# Patient Record
Sex: Female | Born: 1955
Health system: Southern US, Community
[De-identification: ages and names within clinical notes are randomized; demographics above are authoritative.]

## PROBLEM LIST (undated history)

## (undated) DIAGNOSIS — J302 Other seasonal allergic rhinitis: Secondary | ICD-10-CM

## (undated) DIAGNOSIS — G629 Polyneuropathy, unspecified: Secondary | ICD-10-CM

## (undated) DIAGNOSIS — J189 Pneumonia, unspecified organism: Secondary | ICD-10-CM

## (undated) DIAGNOSIS — R7301 Impaired fasting glucose: Secondary | ICD-10-CM

## (undated) DIAGNOSIS — C44599 Other specified malignant neoplasm of skin of other part of trunk: Principal | ICD-10-CM

## (undated) DIAGNOSIS — E039 Hypothyroidism, unspecified: Secondary | ICD-10-CM

## (undated) DIAGNOSIS — R0600 Dyspnea, unspecified: Secondary | ICD-10-CM

## (undated) DIAGNOSIS — F329 Major depressive disorder, single episode, unspecified: Secondary | ICD-10-CM

## (undated) DIAGNOSIS — F32A Depression, unspecified: Secondary | ICD-10-CM

## (undated) DIAGNOSIS — A498 Other bacterial infections of unspecified site: Secondary | ICD-10-CM

## (undated) DIAGNOSIS — F419 Anxiety disorder, unspecified: Secondary | ICD-10-CM

## (undated) DIAGNOSIS — F1110 Opioid abuse, uncomplicated: Secondary | ICD-10-CM

## (undated) DIAGNOSIS — Z8719 Personal history of other diseases of the digestive system: Secondary | ICD-10-CM

## (undated) DIAGNOSIS — E785 Hyperlipidemia, unspecified: Secondary | ICD-10-CM

## (undated) DIAGNOSIS — K589 Irritable bowel syndrome without diarrhea: Secondary | ICD-10-CM

## (undated) DIAGNOSIS — K760 Fatty (change of) liver, not elsewhere classified: Secondary | ICD-10-CM

## (undated) DIAGNOSIS — M5136 Other intervertebral disc degeneration, lumbar region: Secondary | ICD-10-CM

## (undated) DIAGNOSIS — K219 Gastro-esophageal reflux disease without esophagitis: Secondary | ICD-10-CM

## (undated) DIAGNOSIS — G6289 Other specified polyneuropathies: Secondary | ICD-10-CM

## (undated) DIAGNOSIS — Z85828 Personal history of other malignant neoplasm of skin: Secondary | ICD-10-CM

## (undated) DIAGNOSIS — M51369 Other intervertebral disc degeneration, lumbar region without mention of lumbar back pain or lower extremity pain: Secondary | ICD-10-CM

## (undated) DIAGNOSIS — T7840XA Allergy, unspecified, initial encounter: Secondary | ICD-10-CM

## (undated) DIAGNOSIS — I700 Atherosclerosis of aorta: Secondary | ICD-10-CM

## (undated) DIAGNOSIS — D126 Benign neoplasm of colon, unspecified: Secondary | ICD-10-CM

## (undated) DIAGNOSIS — Z87442 Personal history of urinary calculi: Secondary | ICD-10-CM

## (undated) DIAGNOSIS — Z86010 Personal history of colonic polyps: Secondary | ICD-10-CM

## (undated) DIAGNOSIS — D131 Benign neoplasm of stomach: Secondary | ICD-10-CM

## (undated) HISTORY — DX: Impaired fasting glucose: R73.01

## (undated) HISTORY — DX: Atherosclerosis of aorta: I70.0

## (undated) HISTORY — DX: Other specified polyneuropathies: G62.89

## (undated) HISTORY — DX: Other seasonal allergic rhinitis: J30.2

## (undated) HISTORY — DX: Hypothyroidism, unspecified: E03.9

## (undated) HISTORY — DX: Benign neoplasm of stomach: D13.1

## (undated) HISTORY — DX: Allergy, unspecified, initial encounter: T78.40XA

## (undated) HISTORY — PX: CHOLECYSTECTOMY: SHX55

## (undated) HISTORY — DX: Other intervertebral disc degeneration, lumbar region without mention of lumbar back pain or lower extremity pain: M51.369

## (undated) HISTORY — DX: Gastro-esophageal reflux disease without esophagitis: K21.9

## (undated) HISTORY — PX: COLONOSCOPY W/ BIOPSIES: SHX1374

## (undated) HISTORY — DX: Opioid abuse, uncomplicated: F11.10

## (undated) HISTORY — PX: COLONOSCOPY: SHX174

## (undated) HISTORY — DX: Personal history of colonic polyps: Z86.010

## (undated) HISTORY — DX: Fatty (change of) liver, not elsewhere classified: K76.0

## (undated) HISTORY — DX: Other bacterial infections of unspecified site: A49.8

## (undated) HISTORY — DX: Anxiety disorder, unspecified: F41.9

## (undated) HISTORY — DX: Polyneuropathy, unspecified: G62.9

## (undated) HISTORY — DX: Major depressive disorder, single episode, unspecified: F32.9

## (undated) HISTORY — DX: Hyperlipidemia, unspecified: E78.5

## (undated) HISTORY — DX: Irritable bowel syndrome, unspecified: K58.9

## (undated) HISTORY — DX: Personal history of other malignant neoplasm of skin: Z85.828

## (undated) HISTORY — DX: Other specified malignant neoplasm of skin of other part of trunk: C44.599

## (undated) HISTORY — DX: Benign neoplasm of colon, unspecified: D12.6

## (undated) HISTORY — DX: Other intervertebral disc degeneration, lumbar region: M51.36

## (undated) HISTORY — DX: Depression, unspecified: F32.A

## (undated) HISTORY — PX: LAPAROSCOPIC ENDOMETRIOSIS FULGURATION: SUR769

## (undated) HISTORY — PX: UPPER GASTROINTESTINAL ENDOSCOPY: SHX188

---

## 2009-01-10 HISTORY — PX: OTHER SURGICAL HISTORY: SHX169

## 2010-07-11 DIAGNOSIS — C44599 Other specified malignant neoplasm of skin of other part of trunk: Secondary | ICD-10-CM

## 2010-07-11 HISTORY — DX: Other specified malignant neoplasm of skin of other part of trunk: C44.599

## 2010-10-11 HISTORY — PX: OTHER SURGICAL HISTORY: SHX169

## 2011-05-16 ENCOUNTER — Encounter: Payer: Self-pay | Admitting: Physician Assistant

## 2011-05-16 ENCOUNTER — Ambulatory Visit (INDEPENDENT_AMBULATORY_CARE_PROVIDER_SITE_OTHER): Payer: Self-pay | Admitting: Physician Assistant

## 2011-05-16 VITALS — BP 119/74 | HR 82 | Ht 66.0 in | Wt 156.0 lb

## 2011-05-16 DIAGNOSIS — Z8589 Personal history of malignant neoplasm of other organs and systems: Secondary | ICD-10-CM

## 2011-05-16 DIAGNOSIS — H9193 Unspecified hearing loss, bilateral: Secondary | ICD-10-CM

## 2011-05-16 DIAGNOSIS — Z85831 Personal history of malignant neoplasm of soft tissue: Secondary | ICD-10-CM

## 2011-05-16 DIAGNOSIS — Z7689 Persons encountering health services in other specified circumstances: Secondary | ICD-10-CM

## 2011-05-16 DIAGNOSIS — E039 Hypothyroidism, unspecified: Secondary | ICD-10-CM

## 2011-05-16 DIAGNOSIS — H9190 Unspecified hearing loss, unspecified ear: Secondary | ICD-10-CM

## 2011-05-16 NOTE — Patient Instructions (Addendum)
Will be called with referral for oncologist and hearing testing. Follow up for management of hypothyroidsim.

## 2011-05-16 NOTE — Progress Notes (Addendum)
  Subjective:    Patient ID: Connie Dillon, female    DOB: 1955/11/28, 56 y.o.   MRN: [id number]  HPI Patient presents to the clinic to establish care. PMH was reviewed. 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 2012. She did not have chemo or radiation. She recently moved to [facility] from [facility] and wants to be established with oncology. She is reporting some pain along the incision line today.   She is also on synthroid for hypothyroidism. She does not need refills today and her labs were drawn in January and is requesting they be sent to our office.   She is having some difficulty hearing of the last several months. She wants referral to be evaluated for hearing loss. She is noticing hearing loss in both ears with right being worse than left. She is only on celexa, synthyroid, claritin. No new meds.    Review of Systems     Objective:   Physical Exam  Constitutional: She is oriented to person, place, and time. She appears well-developed and well-nourished.  HENT:  Head: Normocephalic and atraumatic.  Right Ear: External ear normal.  Left Ear: External ear normal.  Nose: Nose normal.  Mouth/Throat: Oropharynx is clear and moist. No oropharyngeal exudate.       TM's were well visualized with light reflex and no cerumen or blockage.  Both ears had loss of gross hearing.  Left ear- Weber was not able to be detected in either ear/[facility] greater than AC.  Right ear- Weber was not able to be detected in either ear/ Rinne AC greater than BC.  Cardiovascular: Normal rate, regular rhythm and normal heart sounds.   Pulmonary/Chest: Effort normal and breath sounds normal. She has no wheezes.  Neurological: She is alert and oriented to person, place, and time.  Skin: Skin is warm and dry.  Psychiatric: She has a normal mood and affect. Her behavior is normal.          Assessment & Plan:  Hx of sarcoma of soft tissue- referral was made to  oncologist.  Hearing loss bilaterally-Will refer for further evaluation. Appears to have sensory hearing loss in right ear and conduction hearing loss in left ear  Discussed need for colonoscopy. Patient does not have insurance at this time and realizes the need and will contact office when needs referral.

## 2011-05-20 NOTE — Progress Notes (Signed)
Addended by: Jomarie Longs on: 05/20/2011 10:01 AM   Modules accepted: Orders

## 2011-05-27 ENCOUNTER — Telehealth: Payer: Self-pay | Admitting: *Deleted

## 2011-05-27 NOTE — Telephone Encounter (Signed)
Patient new to area, time for her 6 month visit/scans with a Systems developer. She states she is having pain at previous incision site, instructed patient to continue with care from [facility] until we are able to establish patient here at [facility]. Patient verbalized understanding, orders to scheduling. Patient Phone # [phone].

## 2011-05-31 ENCOUNTER — Telehealth: Payer: Self-pay | Admitting: Oncology

## 2011-05-31 NOTE — Telephone Encounter (Signed)
Pt called re appt. New pt chart already ib scheduling and pt given appt for 6/3 @ 3 pm (next avail) - ok per Roz (tiffany).

## 2011-06-02 ENCOUNTER — Encounter: Payer: Self-pay | Admitting: Oncology

## 2011-06-13 ENCOUNTER — Ambulatory Visit: Payer: Self-pay

## 2011-06-13 ENCOUNTER — Other Ambulatory Visit (HOSPITAL_BASED_OUTPATIENT_CLINIC_OR_DEPARTMENT_OTHER): Payer: Self-pay | Admitting: Lab

## 2011-06-13 ENCOUNTER — Ambulatory Visit (HOSPITAL_BASED_OUTPATIENT_CLINIC_OR_DEPARTMENT_OTHER): Payer: Self-pay | Admitting: Oncology

## 2011-06-13 ENCOUNTER — Encounter: Payer: Self-pay | Admitting: Oncology

## 2011-06-13 ENCOUNTER — Telehealth: Payer: Self-pay | Admitting: Oncology

## 2011-06-13 VITALS — BP 117/70 | HR 74 | Temp 97.0°F | Ht 64.0 in | Wt 154.6 lb

## 2011-06-13 DIAGNOSIS — C44599 Other specified malignant neoplasm of skin of other part of trunk: Secondary | ICD-10-CM

## 2011-06-13 DIAGNOSIS — R109 Unspecified abdominal pain: Secondary | ICD-10-CM

## 2011-06-13 DIAGNOSIS — Z85828 Personal history of other malignant neoplasm of skin: Secondary | ICD-10-CM

## 2011-06-13 LAB — COMPREHENSIVE METABOLIC PANEL
ALT: 23 U/L (ref 0–35)
AST: 19 U/L (ref 0–37)
Albumin: 4 g/dL (ref 3.5–5.2)
CO2: 25 mEq/L (ref 19–32)
Calcium: 9.1 mg/dL (ref 8.4–10.5)
Chloride: 105 mEq/L (ref 96–112)
Potassium: 3.7 mEq/L (ref 3.5–5.3)
Total Protein: 7.2 g/dL (ref 6.0–8.3)

## 2011-06-13 LAB — CBC WITH DIFFERENTIAL/PLATELET
BASO%: 0.8 % (ref 0.0–2.0)
Eosinophils Absolute: 0.3 10*3/uL (ref 0.0–0.5)
HCT: 40.4 % (ref 34.8–46.6)
LYMPH%: 27.7 % (ref 14.0–49.7)
MCHC: 33.2 g/dL (ref 31.5–36.0)
MONO#: 1.2 10*3/uL — ABNORMAL HIGH (ref 0.1–0.9)
NEUT#: 5.3 10*3/uL (ref 1.5–6.5)
Platelets: 277 10*3/uL (ref 145–400)
RBC: 4.77 10*6/uL (ref 3.70–5.45)
WBC: 9.5 10*3/uL (ref 3.9–10.3)
lymph#: 2.6 10*3/uL (ref 0.9–3.3)
nRBC: 0 % (ref 0–0)

## 2011-06-13 NOTE — Patient Instructions (Addendum)
A.  History of dermatofibrosarcoma of the right flank. -No evidence of local recurrence.   B.  Follow up:  Chest Xray today.  Follow up with my Nurse Practitioner in about 4 months with same day chest Xray.

## 2011-06-13 NOTE — Telephone Encounter (Signed)
appts made and printed for pt aom °

## 2011-06-13 NOTE — Progress Notes (Signed)
Patient came in today as a new patient,she has no insurance but she did already have an Epp application fill out.I offer her a medicaid application but she said she would not qualify,because they turn her down in [place],I told her it maybe different here in [place],she said no thanks.

## 2011-06-14 ENCOUNTER — Encounter: Payer: Self-pay | Admitting: Oncology

## 2011-06-14 NOTE — Progress Notes (Signed)
[facility]  Telephone:([phone]) [phone] Fax:(336) [phone]   MEDICAL ONCOLOGY - INITIAL CONSULATION    Referral MD:  Tandy Gaw, PA-C  Reason for Referral: history of dermatofibrosarcoma.   HPI:  [name] is a 56 year-old woman with history of hypothyroidism, allergy, hyperlipidemia, anxiety, depression.  She was in [facility] until 07/2010 when she developed an enlarging right flank mass.  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  She had an MRI abdomen on 10/22/10 from [facility] which showed right lower quadrant mass in the dermis with minimal extension into the SQ fat; measured 2.8x0.9x1.8cm.  There was no adenopathy.  She was referred to Dr. Meda Klinefelter from [facility] and underwent in 10/2010 wide excision.  Final pathology report is not available for my review.  However, per her previous medical oncologist's note (Dr. Halford Decamp. El-Sayh), margin was widely negative. She has been on observation.  Her CT chest/abd/pel 01/27/2011 was negative.  She recently moved to [place] to be here her son in [place], [place].  She was kindly referred to the Cancer Center to establish oncology care.  [name] presented to the clinic today for the first time by herself.  She reported mild, intermittent right flank pain at the site of sarcoma resection in the past. The pain is slightly crampy, no radiation, no relation to eating or bowel movement.  The pain is not severe enough for her to take chronic pain meds; however, she is worried since it has been 8 months since the operation.  She has not noticed any palpable skin nodule compared to when the sarcoma was first discovered.  She has chronic fatigue; however, she is able to live by herself and is independent of activities of daily living. She has mild DOE on long walk but no PND, orthopnea, chest pain.    Patient denies headache, visual changes, confusion, drenching night sweats, palpable lymph node swelling, mucositis, odynophagia, dysphagia, nausea vomiting, jaundice, chest pain, palpitation, productive cough, gum bleeding, epistaxis, hematemesis, hemoptysis, abdominal swelling, early satiety, melena, hematochezia, hematuria, skin rash, spontaneous bleeding, joint swelling, joint pain, heat or cold intolerance, bowel bladder incontinence, back pain, focal motor weakness.      Past Medical History  Diagnosis Date  . Hyperlipidemia   . Hypothyroid   . Anxiety   . History of SCC (squamous cell carcinoma) of skin   . Depression   . 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 Meda Klinefelter in 10/2010. ([place])  :  Past Surgical History  Procedure Date  . Cholecystectomy   . Lipoma removal 2011  . Laparoscopic endometriosis fulguration 1980's  . Wide excision of dermatofibrosarcoma of right flank 10/2010    by a Dr. Meda Klinefelter in [facility].   :  Current Outpatient Prescriptions  Medication Sig Dispense Refill  . citalopram (CELEXA) 10 MG tablet Take 20 mg by mouth.       Marland Kitchen ibuprofen (ADVIL,MOTRIN) 200 MG tablet Take 200 mg by mouth every 6 (six) hours as needed.      Marland Kitchen levothyroxine (SYNTHROID, LEVOTHROID) 50 MCG tablet Take 50 mcg by mouth daily.      Marland Kitchen loratadine (CLARITIN) 10 MG tablet Take 10 mg by mouth daily.         Allergies  Allergen Reactions  . Peanuts (Peanut Oil) Hives  . Sudafed (Pseudoephedrine Hcl) Palpitations  :  Family History  Problem Relation Age of Onset  . Heart attack Father   . Stroke Father   . Hyperlipidemia Father   . Hypertension Father   . Cancer Paternal Aunt     breast cancer  :  History   Social History  . Marital Status: Single    Spouse Name: N/A    Number of Children: 2  . Years of Education: N/A   Occupational History  .      looking for work now.    Social History Main Topics  .  Smoking status: Former Smoker -- 1.0 packs/day for 10 years    Quit date: 05/15/2004  . Smokeless tobacco: Never Used  . Alcohol Use: No  . Drug Use: No  . Sexually Active: Not Currently   Other Topics Concern  . Not on file   Social History Narrative  . No narrative on file  :  Pertinent items are noted in HPI.  Exam: ECOG 1  General:  well-nourished woman, in no acute distress.  Eyes:  no scleral icterus.  ENT:  There were no oropharyngeal lesions.  Neck was without thyromegaly.  Lymphatics:  Negative cervical, supraclavicular or axillary adenopathy.  Respiratory: lungs were clear bilaterally without wheezing or crackles.  Cardiovascular:  Regular rate and rhythm, S1/S2, without murmur, rub or gallop.  There was no pedal edema.  GI:  abdomen was soft, flat, nontender, nondistended, without organomegaly.  Muscoloskeletal:  no spinal tenderness of palpation of vertebral spine. There was an large excisional scar on her right shoulder without palpable mass.  There was also another excisional scar in the right flank extending anteriorly to the right quadrant.  There was mild discomfort to palpation but no discrete mass.  Skin exam was without echymosis, petichae.  Neuro exam was nonfocal.  Patient was able to get on and off exam table without assistance.  Gait was normal.  Patient was alerted and oriented.  Attention was good.   Language was appropriate.  Mood was normal without depression.  Speech was not pressured.  Thought content was not tangential.     Lab Results  Component Value Date   WBC 9.5 06/13/2011   HGB 13.4 06/13/2011   HCT 40.4 06/13/2011   PLT 277 06/13/2011   GLUCOSE 80 06/13/2011   ALT 23 06/13/2011   AST 19 06/13/2011   NA 140 06/13/2011   K 3.7 06/13/2011   CL 105 06/13/2011   CREATININE 0.73 06/13/2011   BUN 14 06/13/2011   CO2 25 06/13/2011    Assessment and Plan:  1.  History of dermatofibrosarcoma protuberans: - S/p complete excision with negative margin in [place] in 10/2010.    Repeat imaging in 01/2011 showed no residual or metastatic disease.  - I discussed with Ms. Igoe that this cancer has relatively low potential of distant spread; but if it does, lung met is the most common.   Given that she has mild SOB, I went ahead and requested CXR to rule out lung met.  I do not advocate routine CT chest unless abnormal CXR to minimize radiation exposure.  However, she does have persistent right flank pain despite being 8 months out from the resection, I requested a CT abdomen to rule out deep invasion of her disease.   - I discussed with her that there is no role for chemo or radiation in this type of sarcoma unless she has recurrent or metastatic disease that is not resectable.   2.  Depression: she  is on Celexa per PCP.  Her mood seemed stable today.   3.  Hypothyroidism:  She is on Synthroid per PCP.  Her last TSH was within normal range.   4.  Follow up:  In about 4-6 months with same day CT chest.     Thank you for this referral.   The length of time of the face-to-face encounter was 30 minutes. More than 50% of time was spent counseling and coordination of care.

## 2011-06-21 ENCOUNTER — Other Ambulatory Visit: Payer: Self-pay | Admitting: Oncology

## 2011-06-21 ENCOUNTER — Telehealth: Payer: Self-pay | Admitting: *Deleted

## 2011-06-21 ENCOUNTER — Ambulatory Visit (HOSPITAL_COMMUNITY)
Admission: RE | Admit: 2011-06-21 | Discharge: 2011-06-21 | Disposition: A | Discharge status: Home / Self Care | Payer: Self-pay | Source: Ambulatory Visit | Attending: Oncology | Admitting: Oncology

## 2011-06-21 DIAGNOSIS — R109 Unspecified abdominal pain: Secondary | ICD-10-CM

## 2011-06-21 DIAGNOSIS — C44599 Other specified malignant neoplasm of skin of other part of trunk: Secondary | ICD-10-CM

## 2011-06-21 DIAGNOSIS — K449 Diaphragmatic hernia without obstruction or gangrene: Secondary | ICD-10-CM | POA: Insufficient documentation

## 2011-06-21 MED ORDER — IOHEXOL 300 MG/ML  SOLN
100.0000 mL | Freq: Once | INTRAMUSCULAR | Status: AC | PRN
  Administered 2011-06-21: 100 mL via INTRAVENOUS

## 2011-06-21 NOTE — Telephone Encounter (Signed)
THIS REPORT WAS CALLED AND FAXED. THE REPORT WAS GIVEN TO KRISTIN CURICO SINCE DR.HA IS OUT OF THE OFFICE.

## 2011-06-22 ENCOUNTER — Telehealth: Payer: Self-pay | Admitting: Oncology

## 2011-06-22 ENCOUNTER — Other Ambulatory Visit: Payer: Self-pay | Admitting: Oncology

## 2011-06-22 ENCOUNTER — Encounter: Payer: Self-pay | Admitting: Oncology

## 2011-06-22 DIAGNOSIS — J9859 Other diseases of mediastinum, not elsewhere classified: Secondary | ICD-10-CM

## 2011-06-22 DIAGNOSIS — C44599 Other specified malignant neoplasm of skin of other part of trunk: Secondary | ICD-10-CM

## 2011-06-22 NOTE — Telephone Encounter (Signed)
I called and left message impression cell phone today. I informed her that the seed alum and did not show sign of recurrent of her sarcoma. However on the chest x-ray there was a mid sternal nodule that the reduction recommended followup CT of the chest. Most likely this benign however I recommend a CT of the chest as well which will be scheduled in the next week or 2.

## 2011-06-23 ENCOUNTER — Encounter: Payer: Self-pay | Admitting: Oncology

## 2011-06-23 ENCOUNTER — Telehealth: Payer: Self-pay | Admitting: Oncology

## 2011-06-23 NOTE — Progress Notes (Signed)
Patient approve 100% [employer]  06/23/11 - 12/23/11.

## 2011-06-23 NOTE — Telephone Encounter (Signed)
pt called and scheduled ct scan for 06/18 at [facility]

## 2011-06-28 ENCOUNTER — Ambulatory Visit (HOSPITAL_COMMUNITY)
Admission: RE | Admit: 2011-06-28 | Discharge: 2011-06-28 | Disposition: A | Discharge status: Home / Self Care | Payer: Self-pay | Source: Ambulatory Visit | Attending: Oncology | Admitting: Oncology

## 2011-06-28 ENCOUNTER — Telehealth: Payer: Self-pay | Admitting: Oncology

## 2011-06-28 DIAGNOSIS — C44599 Other specified malignant neoplasm of skin of other part of trunk: Secondary | ICD-10-CM

## 2011-06-28 DIAGNOSIS — K449 Diaphragmatic hernia without obstruction or gangrene: Secondary | ICD-10-CM | POA: Insufficient documentation

## 2011-06-28 DIAGNOSIS — J8410 Pulmonary fibrosis, unspecified: Secondary | ICD-10-CM | POA: Insufficient documentation

## 2011-06-28 DIAGNOSIS — I700 Atherosclerosis of aorta: Secondary | ICD-10-CM | POA: Insufficient documentation

## 2011-06-28 DIAGNOSIS — Z9089 Acquired absence of other organs: Secondary | ICD-10-CM | POA: Insufficient documentation

## 2011-06-28 DIAGNOSIS — J9859 Other diseases of mediastinum, not elsewhere classified: Secondary | ICD-10-CM

## 2011-06-28 MED ORDER — IOHEXOL 300 MG/ML  SOLN
80.0000 mL | Freq: Once | INTRAMUSCULAR | Status: AC | PRN
  Administered 2011-06-28: 80 mL via INTRAVENOUS

## 2011-06-28 NOTE — Telephone Encounter (Signed)
I personally called patient.  I personally reviewed the CT chest.  There was no evidence of metastatic sarcoma.  There was a benign appearing calcified nodule that is most likely granuloma.   She expressed relief.  She still reports mild SOB and DOE.  I advised her to f/u with her PCP to see if there is concern for cardiac or pulmonary causes of her SOB.

## 2011-07-06 ENCOUNTER — Other Ambulatory Visit: Payer: Self-pay | Admitting: Physician Assistant

## 2011-07-06 ENCOUNTER — Encounter: Payer: Self-pay | Admitting: Physician Assistant

## 2011-07-06 ENCOUNTER — Ambulatory Visit (INDEPENDENT_AMBULATORY_CARE_PROVIDER_SITE_OTHER): Payer: Self-pay | Admitting: Physician Assistant

## 2011-07-06 VITALS — BP 109/73 | HR 83 | Wt 156.0 lb

## 2011-07-06 DIAGNOSIS — R0602 Shortness of breath: Secondary | ICD-10-CM

## 2011-07-06 DIAGNOSIS — E785 Hyperlipidemia, unspecified: Secondary | ICD-10-CM

## 2011-07-06 MED ORDER — PITAVASTATIN CALCIUM 2 MG PO TABS: ORAL_TABLET | ORAL | Status: DC

## 2011-07-06 MED ORDER — METHYLPREDNISOLONE ACETATE 80 MG/ML IJ SUSP
80.0000 mg | Freq: Once | INTRAMUSCULAR | Status: AC
  Administered 2011-07-06: 80 mg via INTRAMUSCULAR

## 2011-07-06 NOTE — Patient Instructions (Addendum)
Will call with stress test and labs. Follow up in 3 months to recheck cholesterol. Call office if worsening.

## 2011-07-07 ENCOUNTER — Telehealth: Payer: Self-pay | Admitting: *Deleted

## 2011-07-07 LAB — TSH: TSH: 2.453 u[IU]/mL (ref 0.350–4.500)

## 2011-07-07 LAB — T3: T3, Total: 90.2 ng/dL (ref 80.0–204.0)

## 2011-07-07 LAB — BRAIN NATRIURETIC PEPTIDE: Brain Natriuretic Peptide: 34.5 pg/mL (ref 0.0–100.0)

## 2011-07-07 NOTE — Telephone Encounter (Signed)
Pt calls office back and states she thought she was having the T3 and T4 checked because she states that the T4 is usually what she has a problem with . Please advise

## 2011-07-07 NOTE — Telephone Encounter (Signed)
See if we can add T4 and T3.

## 2011-07-07 NOTE — Telephone Encounter (Signed)
Additional thyroid labs added. KG LPN

## 2011-07-08 ENCOUNTER — Other Ambulatory Visit: Payer: Self-pay | Admitting: Physician Assistant

## 2011-07-08 ENCOUNTER — Telehealth: Payer: Self-pay | Admitting: Physician Assistant

## 2011-07-08 DIAGNOSIS — R0602 Shortness of breath: Secondary | ICD-10-CM

## 2011-07-08 MED ORDER — PREDNISONE 20 MG PO TABS: 20.0000 mg | ORAL_TABLET | Freq: Two times a day (BID) | ORAL | Status: AC

## 2011-07-08 MED ORDER — ALBUTEROL SULFATE HFA 108 (90 BASE) MCG/ACT IN AERS: 2.0000 | INHALATION_SPRAY | Freq: Four times a day (QID) | RESPIRATORY_TRACT | Status: DC | PRN

## 2011-07-08 NOTE — Telephone Encounter (Signed)
Pt returned call and I called her back and LM to inform her to go to [facility] [phone]) for her d-dimer to be drawn.

## 2011-07-08 NOTE — Progress Notes (Signed)
LM for pt to call back to see how she is feeling.

## 2011-07-08 NOTE — Telephone Encounter (Signed)
LMOM for pt with instructions and to return call to make sure she received it.

## 2011-07-08 NOTE — Telephone Encounter (Signed)
Pt returned my call about how she is feeling. Pt states with activity she is [facility] even with walking from one room to another. States that she is not having any side effects from the cholesterol med. She is concerned about her TSH going up and down year to year. She said you also mentioned about doing a stress test on her. Please advise.

## 2011-07-08 NOTE — Telephone Encounter (Signed)
Order is in for stress test, should be called today or Monday. Since SOB is not improving with steroid lets get a d-dimer to today to see if when need to do scan to look for PE. Come in today.

## 2011-07-08 NOTE — Progress Notes (Signed)
  Subjective:    Patient ID: Connie Dillon, female    DOB: [birth year], 56 y.o.   MRN: [id number]  HPI Patient presents to clinic with SOB that is recent for about 2-3 weeks. Pt sees oncology and has been released as far as sarcoma. He did order a chest CT which showed no masses but a 9mm granuloma along with hital hernia. CBC  Was also done with hgB 13.4 and WBC was 9.5. WBC Upper limit normal but still normal. SOB has started to interfer with daily activities. She has a sedentary job but even getting up and moving around causes her to be out of breath. Movement makes worse. Does not describe any chest pain, palpitations, lower ext swelling or changes. No recent travel, surgery, changes in meds, hospitalization. She did smoke of 10 years but quit 12 years ago. No hx of asthma or pulmonary disorders. She has hx of bad sinus infection but denies any sinus pressure or symptoms today. No fever, chills, myaglias.   Not had lipids checked recently. Not able to tolerate statins due to heart racing. Lipids have been elevated for years. CT of chest did show mild plaque in thoracic region.   Discussed anxiety. Pt does not feel this could be anxiety related.   Review of Systems     Objective:   Physical Exam  Constitutional: She is oriented to person, place, and time. She appears well-developed and well-nourished.  HENT:  Head: Normocephalic and atraumatic.  Cardiovascular: Normal rate, regular rhythm, normal heart sounds and intact distal pulses.   No murmur heard. Pulmonary/Chest: Effort normal and breath sounds normal. She has no wheezes.  Neurological: She is alert and oriented to person, place, and time.  Skin: Skin is warm and dry. She is not diaphoretic.  Psychiatric: She has a normal mood and affect. Her behavior is normal.          Assessment & Plan:  SOB- EKG- NSR and axis/No ST changes. SP02 was 96%. Gave depo medrol in office for any inflammation. Pt is not able to use albuterol  inhalers and did not want to keep one on hand due to making her heart race. Will call with stress test and labs. Need to keep SOB from hital hernia and worsening anxiety on differential. Follow up in 3 months. Call office if worsening.   Hyperlipidemia- Gave old labs where LDL was elevated. Gave rx for livalo to try daily to see if she tolerates will recheck in 3 months.

## 2011-07-08 NOTE — Progress Notes (Signed)
Will you find out if patient is doing any better with SOB?

## 2011-07-11 ENCOUNTER — Telehealth: Payer: Self-pay | Admitting: *Deleted

## 2011-07-11 NOTE — Telephone Encounter (Signed)
Exercise stress test was ordered it is in file under ECG. Will remind Connie Dillon to scheduled. Can give a cheaper statin but it does not have the lower side effect profile of Livalo. Is she will to try another regular statin? Never heard of orthozine. Have pharmacy send information on it and will prescribe if will be beneficial.

## 2011-07-11 NOTE — Telephone Encounter (Signed)
Pt states that she cant afford the Livalo you gave her. States even with the card it $75. She would like something else called that is cheaper. She also states she cant take the bronchodialator you called in. States that she cant take anything with Efedrine in it and that the pharmacy suggested orthozine. States she got a vitamin called Clear lungs but it only helps when she is at rest. She is also asking about the stress test that is suppose to be scheduled. I didn't see it in the chart. Please advise.

## 2011-07-12 ENCOUNTER — Other Ambulatory Visit: Payer: Self-pay | Admitting: Physician Assistant

## 2011-07-12 MED ORDER — PRAVASTATIN SODIUM 40 MG PO TABS: 40.0000 mg | ORAL_TABLET | Freq: Every day | ORAL | Status: DC

## 2011-07-12 NOTE — Telephone Encounter (Signed)
Informed pt that jen is scheduling the stress test. Pt is willing to try a cheaper statin. States she will call pharmacy and have them send over information on the Orthozine to you.

## 2011-07-12 NOTE — Telephone Encounter (Signed)
LM for pt to returncall

## 2011-07-15 ENCOUNTER — Ambulatory Visit: Payer: Self-pay | Admitting: Physician Assistant

## 2011-07-15 ENCOUNTER — Ambulatory Visit (INDEPENDENT_AMBULATORY_CARE_PROVIDER_SITE_OTHER): Payer: Self-pay | Admitting: Physician Assistant

## 2011-07-15 ENCOUNTER — Encounter: Payer: Self-pay | Admitting: Physician Assistant

## 2011-07-15 VITALS — BP 107/66 | HR 91 | Ht 66.0 in | Wt 157.0 lb

## 2011-07-15 DIAGNOSIS — R0602 Shortness of breath: Secondary | ICD-10-CM

## 2011-07-15 NOTE — Patient Instructions (Addendum)
Gave Dexilant once a day. Call and let us know if working. Will call as soon as we get Stress test results.

## 2011-07-18 ENCOUNTER — Encounter: Payer: Self-pay | Admitting: *Deleted

## 2011-07-18 ENCOUNTER — Ambulatory Visit: Payer: Self-pay | Admitting: Physician Assistant

## 2011-07-18 DIAGNOSIS — Z0289 Encounter for other administrative examinations: Secondary | ICD-10-CM

## 2011-07-18 NOTE — Progress Notes (Signed)
  Subjective:    Patient ID: Connie Dillon, female    DOB: September 13, 1955, 56 y.o.   MRN: [id number]  HPI Patient comes in for spirometry and f/u on SOB. Shortness of Breath has not gotten any worse but is still constant but worse with exertion. She also now has pain right in between breast. She does not notice any correlation with what she eats and pain. She does have an hital hernia. She has her stress test scheduled for next week. She denies any radiation of chest pain. The recuse inhaler that was given does not help when she takes it. The prednisone did not help at all.   Review of Systems     Objective:   Physical Exam  Constitutional: She is oriented to person, place, and time. She appears well-developed and well-nourished.  HENT:  Head: Normocephalic and atraumatic.  Cardiovascular: Normal rate, regular rhythm and normal heart sounds.   Pulmonary/Chest: Effort normal and breath sounds normal. She has no wheezes.  Neurological: She is alert and oriented to person, place, and time.  Skin: Skin is warm and dry.  Psychiatric: She has a normal mood and affect. Her behavior is normal.          Assessment & Plan:  SOB/atypical chest pain- SPO2 is 97%. Spirometry is normal. No sig improvement after neb treatment. Told pt to call stress test center and ask to be put on list to call for any cancellations. If not will call with results as soon as we get. Reassured that SOB is not likely pulmonary in cause. Chest CT, EKG, d dimer, bnp and spirometry were all normal. Stress test will be last work up for cardiac. Let's see if GERD could be causing some of these symptoms. Gave samples of Dexilant to start daily in am before breakfast. Call if need script b/c working.

## 2011-07-21 ENCOUNTER — Telehealth: Payer: Self-pay | Admitting: *Deleted

## 2011-07-21 MED ORDER — PANTOPRAZOLE SODIUM 40 MG PO TBEC: 40.0000 mg | DELAYED_RELEASE_TABLET | Freq: Every day | ORAL | Status: DC

## 2011-07-21 NOTE — Telephone Encounter (Signed)
90 day supply of Protonix 40mg  sent to [facility].

## 2011-07-21 NOTE — Telephone Encounter (Signed)
Called [facility] for price on Protonix 40 mg and have called pt to let me know if she is interested in me send rx to them. LMOM for pt to return call.

## 2011-07-21 NOTE — Telephone Encounter (Signed)
Check [facility]. I think protonix is on their cheaper list if you get a 3 or 6 month supply. Please check price and if pt agree send rx to [facility] they also do free shipping.

## 2011-07-21 NOTE — Telephone Encounter (Signed)
Spoke with pt and she will call back with how many month supply she would like.

## 2011-07-21 NOTE — Telephone Encounter (Signed)
Pt states that the Dexilant is helping and she would like to wait on doing the stress test. I have given pt a coupon card for the Dexilant since she has no insurance. States she was told med was expensive and states maybe there is something that will be cheaper that will do the same job. I gave pt samples. Please advise if we should send rx for Dexilant or something different.

## 2011-07-25 ENCOUNTER — Encounter: Payer: Self-pay | Admitting: Physician Assistant

## 2011-07-29 ENCOUNTER — Other Ambulatory Visit: Payer: Self-pay | Admitting: Physician Assistant

## 2011-07-29 NOTE — Telephone Encounter (Signed)
Pt notified and states she already has an inhaler and will use that and if no better will let us know. KG LPN

## 2011-07-29 NOTE — Telephone Encounter (Signed)
Pt calls wanting to know if she could get some more prednisone for her breathing issue

## 2011-07-29 NOTE — Telephone Encounter (Signed)
Will not refill prednisone. If she is still having SOB she can come get sample of a daily ICS. That she would use Qvar 1 puff twice a day. If that helps we could call her in some.

## 2011-08-24 ENCOUNTER — Encounter: Payer: Self-pay | Admitting: Physician Assistant

## 2011-08-24 ENCOUNTER — Ambulatory Visit (INDEPENDENT_AMBULATORY_CARE_PROVIDER_SITE_OTHER): Payer: Self-pay | Admitting: Physician Assistant

## 2011-08-24 VITALS — BP 110/67 | HR 90 | Ht 66.0 in | Wt 155.0 lb

## 2011-08-24 DIAGNOSIS — L821 Other seborrheic keratosis: Secondary | ICD-10-CM

## 2011-08-24 DIAGNOSIS — L919 Hypertrophic disorder of the skin, unspecified: Secondary | ICD-10-CM

## 2011-08-24 DIAGNOSIS — Q828 Other specified congenital malformations of skin: Secondary | ICD-10-CM

## 2011-08-24 NOTE — Progress Notes (Signed)
  Subjective:    Patient ID: Connie Dillon, female    DOB: 1955/02/16, 56 y.o.   MRN: [id number]  HPI Patient presents to clinic with moles that she is worried about. She has had them for at least 3 months but they continue to worry her. She has 3 lesions under her right arm as well as 3-4 on her abdomen and one under her right breast. They itch but are not painful. They have not bled. The one under her right breast is irritated by wearing her bra and the ones under her arm itch all the time. The other lesions do not bother her. She does have a hx of sarcoma and any skin lesions cause her to worry.    Review of Systems     Objective:   Physical Exam  Constitutional: She is oriented to person, place, and time. She appears well-developed and well-nourished.  HENT:  Head: Normocephalic and atraumatic.  Neurological: She is alert and oriented to person, place, and time.  Skin:       2 skin tags under right axilla. 1 SK under right axilla. Other SK's all over trunk more specific SK under right breast.   Psychiatric: She has a normal mood and affect. Her behavior is normal.          Assessment & Plan:  Cryotherapy Procedure Note  Pre-operative Diagnosis: Seborheic keratosis and Skin tags  Post-operative Diagnosis: same  Locations:  (2 skin tags and 1 SK right axilla, 1 SK under right breast)   Indications: irritation  Anesthesia: not required   Procedure Details  History of allergy to iodine: no. Pacemaker? no.  Patient informed of risks (permanent scarring, infection, light or dark discoloration, bleeding, infection, weakness, numbness and recurrence of the lesion) and benefits of the procedure and verbal informed consent obtained.  The areas are treated with liquid nitrogen therapy, frozen until ice ball extended 2 mm beyond lesion, allowed to thaw, and treated again. The patient tolerated procedure well.  The patient was instructed on post-op care, warned that there may be  blister formation, redness and pain. Recommend OTC analgesia as needed for pain.  Condition: Stable  Complications: none.  Plan: 1. Instructed to keep the area dry and covered for 24-48h and clean thereafter. 2. Warning signs of infection were reviewed.   3. Recommended that the patient use OTC acetaminophen as needed for pain.

## 2011-08-24 NOTE — Patient Instructions (Addendum)
1. Instructed to keep the area dry and covered for 24-48h and clean thereafter. 2. Warning signs of infection were reviewed.   3. Recommended that the patient use OTC acetaminophen as needed for pain.   Seborrheic Keratosis Seborrheic keratosis is a common, noncancerous (benign) skin growth that can occur anywhere on the skin.It looks like "stuck-on," waxy, rough, tan, brown, or black spots on the skin. These skin growths can be flat or raised.They are often called "barnacles" because of their pasted-on appearance.Usually, these skin growths appear in adulthood, around age 65, and increase in number as you age. They may also develop during pregnancy or following estrogen therapy. Many people may only have one growth appear in their lifetime, while some people may develop many growths. CAUSES It is unknown what causes these skin growths, but they appear to run in families. SYMPTOMS Seborrheic keratosis is often located on the face, chest, shoulders, back, or other areas. These growths are:  Usually painless, but may become irritated and itchy.   Yellow, brown, black, or other colors.   Slightly raised or have a flat surface.   Sometimes rough or wart-like in texture.   Often waxy on the surface.   Round or oval-shaped.   Sometimes "stuck-on" in appearance.   Sometimes single, but there are usually many growths.  Any growth that bleeds, itches on a regular basis, becomes inflamed, or becomes irritated needs to be evaluated by a skin specialist (dermatologist). DIAGNOSIS Diagnosis is mainly based on the way the growths appear. In some cases, it can be difficult to tell this type of skin growth from skin cancer. A skin growth tissue sample (biopsy) may be used to confirm the diagnosis. TREATMENT Most often, treatment is not needed because the skin growths are benign.If the skin growth is irritated easily by clothing or jewelry, causing it to scab or bleed, treatment may be recommended.  Patients may also choose to have the growths removed because they do not like their appearance. Most commonly, these growths are treated with cryosurgery. In cryosurgery, liquid nitrogen is applied to "freeze" the growth. The growth usually falls off within a matter of days. A blister may form and dry into a scab that will also fall off. After the growth or scab falls off, it may leave a dark or light spot on the skin. This color may fade over time, or it may remain permanent on the skin. HOME CARE INSTRUCTIONS If the skin growths are treated with cryosurgery, the treated area needs to be kept clean with water and soap. SEEK MEDICAL CARE IF:  You have questions about these growths or other skin problems.   You develop new symptoms, including:   A change in the appearance of the skin growth.   New growths.   Any bleeding, itching, or pain in the growths.   A skin growth that looks similar to seborrheic keratosis.  Document Released: 01/29/2010 Document Revised: 12/16/2010 Document Reviewed: 01/29/2010 [facility] Patient Information 2012 [place], [place].

## 2011-10-07 ENCOUNTER — Ambulatory Visit: Payer: Self-pay | Admitting: Physician Assistant

## 2011-10-14 ENCOUNTER — Telehealth: Payer: Self-pay | Admitting: *Deleted

## 2011-10-14 ENCOUNTER — Telehealth: Payer: Self-pay | Admitting: Oncology

## 2011-10-14 NOTE — Telephone Encounter (Signed)
pt called to r/s her 10/18 to 10/21   aom

## 2011-10-14 NOTE — Telephone Encounter (Signed)
Pt called wants to r/s her lab and office visit appt to afternoon.  Transferred her to scheduling.

## 2011-10-19 ENCOUNTER — Ambulatory Visit (HOSPITAL_COMMUNITY)
Admission: RE | Admit: 2011-10-19 | Discharge: 2011-10-19 | Disposition: A | Discharge status: Home / Self Care | Payer: Self-pay | Source: Ambulatory Visit | Attending: Oncology | Admitting: Oncology

## 2011-10-19 DIAGNOSIS — C44599 Other specified malignant neoplasm of skin of other part of trunk: Secondary | ICD-10-CM

## 2011-10-19 DIAGNOSIS — R109 Unspecified abdominal pain: Secondary | ICD-10-CM

## 2011-10-19 DIAGNOSIS — R911 Solitary pulmonary nodule: Secondary | ICD-10-CM | POA: Insufficient documentation

## 2011-10-20 ENCOUNTER — Telehealth: Payer: Self-pay | Admitting: *Deleted

## 2011-10-20 NOTE — Telephone Encounter (Signed)
Message copied by Wende Mott on Thu Oct 20, 2011  1:23 PM ------      Message from: HA, Raliegh Ip T      Created: Thu Oct 20, 2011  1:03 PM       Please call pt. Her CXR was negative; no evidence of metastatic sarcoma.  Continue observation for history of sarcoma.  Thanks.

## 2011-10-20 NOTE — Telephone Encounter (Signed)
Pt returned call and I informed her of CXR negative w/ no evidence of sarcoma per Dr. Gaylyn Rong.  Keep appt on 10/21 as scheduled.  She verbalized understanding.

## 2011-10-20 NOTE — Telephone Encounter (Signed)
Left pt VM for her to return call.

## 2011-10-28 ENCOUNTER — Ambulatory Visit: Payer: Self-pay | Admitting: Oncology

## 2011-10-28 ENCOUNTER — Other Ambulatory Visit: Payer: Self-pay | Admitting: Lab

## 2011-10-31 ENCOUNTER — Ambulatory Visit (HOSPITAL_BASED_OUTPATIENT_CLINIC_OR_DEPARTMENT_OTHER): Payer: Self-pay | Admitting: Oncology

## 2011-10-31 ENCOUNTER — Telehealth: Payer: Self-pay | Admitting: Oncology

## 2011-10-31 ENCOUNTER — Other Ambulatory Visit (HOSPITAL_BASED_OUTPATIENT_CLINIC_OR_DEPARTMENT_OTHER): Payer: Self-pay | Admitting: Lab

## 2011-10-31 VITALS — BP 109/68 | HR 83 | Temp 97.6°F | Resp 20 | Ht 66.0 in | Wt 158.1 lb

## 2011-10-31 DIAGNOSIS — R109 Unspecified abdominal pain: Secondary | ICD-10-CM

## 2011-10-31 DIAGNOSIS — C44599 Other specified malignant neoplasm of skin of other part of trunk: Secondary | ICD-10-CM

## 2011-10-31 DIAGNOSIS — C4499 Other specified malignant neoplasm of skin, unspecified: Secondary | ICD-10-CM

## 2011-10-31 DIAGNOSIS — E039 Hypothyroidism, unspecified: Secondary | ICD-10-CM

## 2011-10-31 DIAGNOSIS — F329 Major depressive disorder, single episode, unspecified: Secondary | ICD-10-CM

## 2011-10-31 LAB — CBC WITH DIFFERENTIAL/PLATELET
Basophils Absolute: 0.1 10*3/uL (ref 0.0–0.1)
Eosinophils Absolute: 0.2 10*3/uL (ref 0.0–0.5)
HCT: 39.9 % (ref 34.8–46.6)
HGB: 13.2 g/dL (ref 11.6–15.9)
MCV: 87.1 fL (ref 79.5–101.0)
MONO%: 11.7 % (ref 0.0–14.0)
NEUT#: 4.6 10*3/uL (ref 1.5–6.5)
NEUT%: 60.2 % (ref 38.4–76.8)
RDW: 12.9 % (ref 11.2–14.5)

## 2011-10-31 LAB — COMPREHENSIVE METABOLIC PANEL (CC13)
Albumin: 3.7 g/dL (ref 3.5–5.0)
Alkaline Phosphatase: 74 U/L (ref 40–150)
BUN: 10 mg/dL (ref 7.0–26.0)
Calcium: 10 mg/dL (ref 8.4–10.4)
Chloride: 107 mEq/L (ref 98–107)
Creatinine: 0.8 mg/dL (ref 0.6–1.1)
Glucose: 94 mg/dl (ref 70–99)
Potassium: 4 mEq/L (ref 3.5–5.1)

## 2011-10-31 NOTE — Telephone Encounter (Signed)
appts made and printed for the pt,pt will get a cxr after the lab  aom

## 2011-11-01 ENCOUNTER — Encounter: Payer: Self-pay | Admitting: Oncology

## 2011-11-01 NOTE — Progress Notes (Signed)
[facility]  [phone] [phone]   OFFICE PROGRESS NOTE   Cc:  BREEBACK, JADE, PA-C  DIAGNOSIS: History of dermatofibrosarcoma protuberans  PAST THERAPY: She underwent a shave biopsy on 08/10/2010 with pathology (from [facility]; case # [id number]) consistent with dermatofibrosarcoma protuberans, transected at base. She had an MRI abdomen on 10/22/10 from [facility] which showed right lower quadrant mass in the dermis with minimal extension into the SQ fat; measured 2.8x0.9x1.8cm. There was no adenopathy. She was referred to Dr. Meda Klinefelter from [facility] and underwent in 10/2010 wide excision. Final pathology report is not available for my review. However, per her previous medical oncologist's note (Dr. Halford Decamp. [place]Say[place]), margin was widely negative.  CURRENT THERAPY: Watchful observation  INTERVAL HISTORY: Connie Dillon 56 y.o. female returns for routine follow-up by herself. She has continued to do well. She has intermittent pain to her right lower abdomen at the site of her surgery. She does not take any pain medications for this. She has not noticed any new skin lesions. She has chronic fatigue, but she is working full-time in a temporary position. Denies chest pain, shortness of breath, dyspnea. No bleeding. Appetite and weight remain stable.  Past Medical History  Diagnosis Date  . Hyperlipidemia   . Hypothyroid   . Anxiety   . History of SCC (squamous cell carcinoma) of skin   . Depression   . 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 Meda Klinefelter in 10/2010. [employer])  . Mediastinal mass 06/22/2011    Past Surgical History  Procedure Date  . Cholecystectomy   . Lipoma removal 2011  . Laparoscopic endometriosis fulguration 1980's  . Wide excision of dermatofibrosarcoma of right flank 10/2010    by a Dr. Meda Klinefelter in [facility].     Current Outpatient Prescriptions   Medication Sig Dispense Refill  . citalopram (CELEXA) 10 MG tablet Take 20 mg by mouth.       Marland Kitchen ibuprofen (ADVIL,MOTRIN) 200 MG tablet Take 200 mg by mouth every 6 (six) hours as needed.      Marland Kitchen levothyroxine (SYNTHROID, LEVOTHROID) 50 MCG tablet Take 50 mcg by mouth daily.      Marland Kitchen loratadine (CLARITIN) 10 MG tablet Take 10 mg by mouth daily.      . pantoprazole (PROTONIX) 40 MG tablet Take 1 tablet (40 mg total) by mouth daily.  90 tablet  0    ALLERGIES:  is allergic to peanuts; salmon; and sudafed.  REVIEW OF SYSTEMS:  The rest of the 14-point review of system was negative.   Filed Vitals:   10/31/11 1541  BP: 109/68  Pulse: 83  Temp: 97.6 F (36.4 C)  Resp: 20   Wt Readings from Last 3 Encounters:  10/31/11 158 lb 1.6 oz (71.714 kg)  08/24/11 155 lb (70.308 kg)  07/15/11 157 lb (71.215 kg)   ECOG Performance status: 1  PHYSICAL EXAMINATION:   General: well-nourished woman, in no acute distress. Eyes: no scleral icterus. ENT: There were no oropharyngeal lesions. Neck was without thyromegaly. Lymphatics: Negative cervical, supraclavicular or axillary adenopathy. Respiratory: lungs were clear bilaterally without wheezing or crackles. Cardiovascular: Regular rate and rhythm, S1/S2, without murmur, rub or gallop. There was no pedal edema. GI: abdomen was soft, flat, nontender, nondistended, without organomegaly. Muscoloskeletal: no spinal tenderness of palpation of vertebral spine. There was an large excisional scar on her right shoulder without palpable mass. There was also another excisional  scar in the right flank extending anteriorly to the right quadrant. There was mild discomfort to palpation but no discrete mass. Skin exam was without echymosis, petichae. Neuro exam was nonfocal. Patient was able to get on and off exam table without assistance. Gait was normal. Patient was alerted and oriented. Attention was good. Language was appropriate. Mood was normal without depression. Speech  was not pressured. Thought content was not tangential.    LABORATORY/RADIOLOGY DATA:  Lab Results  Component Value Date   WBC 7.6 10/31/2011   HGB 13.2 10/31/2011   HCT 39.9 10/31/2011   PLT 285 10/31/2011   GLUCOSE 94 10/31/2011   ALKPHOS 74 10/31/2011   ALT 24 10/31/2011   AST 17 10/31/2011   NA 140 10/31/2011   K 4.0 10/31/2011   CL 107 10/31/2011   CREATININE 0.8 10/31/2011   BUN 10.0 10/31/2011   CO2 24 10/31/2011    Dg Chest 2 View  10/19/2011  *RADIOLOGY REPORT*  Clinical Data: History of fibrosarcoma.  CHEST - 2 VIEW  Comparison: 06/28/2011.  Findings: The cardiac silhouette, mediastinal and hilar contours are normal and stable.  The lungs are clear.  The substernal nodule identified on the prior chest x-ray was a calcified granuloma on the chest CT.  No other pulmonary nodules.  No pleural effusion. The bony thorax is intact.  IMPRESSION:  1.  No acute cardiopulmonary findings. 2.  Stable substernal calcified granuloma.   Original Report Authenticated By: P. Loralie Champagne, M.D.     ASSESSMENT AND PLAN:   1. History of dermatofibrosarcoma protuberans:  - S/p complete excision with negative margin in [place] in 10/2010. Repeat imaging in 01/2011 showed no residual or metastatic disease.  - I discussed with [name] that this cancer has relatively low potential of distant spread; but if it does, lung met is the most common. Recent CXR was negative. - I discussed with her that there is no role for chemo or radiation in this type of sarcoma unless she has recurrent or metastatic disease that is not resectable.   2. Depression: she is on Celexa per PCP. Her mood seemed stable today.   3. Hypothyroidism: She is on Synthroid per PCP. Her last TSH was within normal range. States she is due for a repeat TSH by PCP soon.  4. Follow up: In about 6 months with CXR prior to visit..      The length of time of the face-to-face encounter was 15   minutes. More than 50% of time was spent  counseling and coordination of care.

## 2011-11-21 ENCOUNTER — Ambulatory Visit (INDEPENDENT_AMBULATORY_CARE_PROVIDER_SITE_OTHER): Payer: Self-pay | Admitting: Physician Assistant

## 2011-11-21 ENCOUNTER — Encounter: Payer: Self-pay | Admitting: Physician Assistant

## 2011-11-21 VITALS — BP 97/59 | HR 82 | Ht 66.0 in | Wt 161.0 lb

## 2011-11-21 DIAGNOSIS — E039 Hypothyroidism, unspecified: Secondary | ICD-10-CM

## 2011-11-21 DIAGNOSIS — E559 Vitamin D deficiency, unspecified: Secondary | ICD-10-CM

## 2011-11-21 MED ORDER — LEVOTHYROXINE SODIUM 50 MCG PO TABS: 50.0000 ug | ORAL_TABLET | Freq: Every day | ORAL | Status: DC

## 2011-11-21 MED ORDER — CITALOPRAM HYDROBROMIDE 20 MG PO TABS: 20.0000 mg | ORAL_TABLET | Freq: Every day | ORAL | Status: DC

## 2011-11-21 MED ORDER — LORATADINE 10 MG PO TABS: 10.0000 mg | ORAL_TABLET | Freq: Every day | ORAL | Status: DC

## 2011-11-21 NOTE — Patient Instructions (Addendum)
Get labs in 8 weeks

## 2011-11-23 ENCOUNTER — Telehealth: Payer: Self-pay | Admitting: Physician Assistant

## 2011-11-23 NOTE — Telephone Encounter (Signed)
I am trying to find medication for pt that she can tolerate ask her if she has ever tried niacin and Questran?   If no intolerance to date I will send both to pharmacy. Since not able to tolerate statins and cost is an issue.

## 2011-11-23 NOTE — Telephone Encounter (Signed)
Connie Dillon states she will try niacin.

## 2011-11-23 NOTE — Progress Notes (Signed)
  Subjective:    Patient ID: Connie Dillon, female    DOB: 1955/09/11, 56 y.o.   MRN: [id number]  HPI Patient presents to the clinic to follow up on hypothyroidism. She ran out of 50's mcg and has been taking 1/2 of which are old. Will not recheck today. She feels pretty good. She is fatigued but has been since cancer. NO changes since last visit.   She has not been taking Vitamin d. She forgot to start.   She also stopped taking pravastatin because she thought it was making her gain weight. She has had problems tolerating all statins that she has tried. She has tried zetia and not been able to tolerate. We have never checked Lipid because patient knows lipids are elevated sometimes above 300.    Review of Systems     Objective:   Physical Exam  Constitutional: She is oriented to person, place, and time. She appears well-developed and well-nourished.       Overweight.  HENT:  Head: Normocephalic and atraumatic.  Neck: No thyromegaly present.  Cardiovascular: Normal rate, regular rhythm and normal heart sounds.   Pulmonary/Chest: Effort normal and breath sounds normal.  Neurological: She is alert and oriented to person, place, and time.  Skin: Skin is warm and dry.  Psychiatric: She has a normal mood and affect. Her behavior is normal.          Assessment & Plan:  Hypothyroidism- I do not want to check TSH today b/c not on a steady dose on original dose. Will refill of levothyroxine and gave pt lab slip to have rechecked in 8 weeks. Do not run out of meds before testing.   Hyperlipidemia- NO need to check lipid level today since we know will be elevated. Discussed with patient trying livalo but does not have insurance and cannot afford. Consider Welchol but cost is also an option. Failed statins and zetia. I told pt I would call back with my suggestions after had some time to think about cost and efficacy.   Vitamin D- Need to start taking vitamin D and gave lab slip to  get checked in 8 weeks.

## 2011-11-23 NOTE — Telephone Encounter (Signed)
LMOM for Pt to CB 

## 2011-11-24 MED ORDER — NIACIN ER (ANTIHYPERLIPIDEMIC) 500 MG PO TBCR: EXTENDED_RELEASE_TABLET | ORAL | Status: DC

## 2011-11-24 MED ORDER — CHOLESTYRAMINE 4 G PO PACK: 1.0000 | PACK | Freq: Three times a day (TID) | ORAL | Status: DC

## 2011-11-24 NOTE — Telephone Encounter (Signed)
LMOM informing Pt  

## 2011-11-24 NOTE — Telephone Encounter (Signed)
Call pt: Let pt know that i sent niacin to pharm bottle will say take 2 tabs before bed. I want her to start taking 1 tab before bed and see how she does with that for 3-4 weeks and if tolerates well can go to 2 tabs. Niacin can create some flushing. Take with cracker and take ASA before could help.   This medicine is also better in combination with Questran. I sent packet to take before meals. Start with 1 a day then can increase to up to 3 times a day.   Will recheck LDL in 3 months. Low fat diet.

## 2011-12-16 ENCOUNTER — Encounter: Payer: Self-pay | Admitting: Physician Assistant

## 2011-12-16 ENCOUNTER — Ambulatory Visit (INDEPENDENT_AMBULATORY_CARE_PROVIDER_SITE_OTHER): Payer: Self-pay | Admitting: Physician Assistant

## 2011-12-16 VITALS — BP 97/62 | HR 100 | Temp 98.3°F | Ht 66.0 in | Wt 159.0 lb

## 2011-12-16 DIAGNOSIS — B009 Herpesviral infection, unspecified: Secondary | ICD-10-CM

## 2011-12-16 DIAGNOSIS — A499 Bacterial infection, unspecified: Secondary | ICD-10-CM

## 2011-12-16 DIAGNOSIS — B001 Herpesviral vesicular dermatitis: Secondary | ICD-10-CM

## 2011-12-16 DIAGNOSIS — J329 Chronic sinusitis, unspecified: Secondary | ICD-10-CM

## 2011-12-16 DIAGNOSIS — H1089 Other conjunctivitis: Secondary | ICD-10-CM

## 2011-12-16 DIAGNOSIS — H109 Unspecified conjunctivitis: Secondary | ICD-10-CM

## 2011-12-16 MED ORDER — VALACYCLOVIR HCL 1 G PO TABS: ORAL_TABLET | ORAL | Status: DC

## 2011-12-16 MED ORDER — AMOXICILLIN-POT CLAVULANATE 875-125 MG PO TABS: 1.0000 | ORAL_TABLET | Freq: Two times a day (BID) | ORAL | Status: DC

## 2011-12-16 MED ORDER — POLYMYXIN B-TRIMETHOPRIM 10000-0.1 UNIT/ML-% OP SOLN: 1.0000 [drp] | OPHTHALMIC | Status: DC

## 2011-12-16 NOTE — Progress Notes (Signed)
  Subjective:    Patient ID: Connie Dillon, female    DOB: 1955-04-13, 56 y.o.   MRN: [id number]  HPI Patient is a 56 year old female who presents to the clinic with upper respiratory symptoms and bilateral itchy red draining eyes. Patient has had sinus congestion, cough, nasal congestion for over a week. She has been using over-the-counter Mucinex and cold preparations. They have help some. This morning she woke up and her eyes were bloodshot and crusted together. She had to use a cloth to get all the drainage away so she could open her eyes. She has been around her grandchildren this weekend who were sick. She denies any fever, chills, shortness of breath. She has had some sweating episodes where she feels very hot. Her ears bilaterally are both painful and feels very congested. She has even broke out with a cold sore that happens when she gets sick. Cold sores been present for a day and is progressively getting worse even despite over-the-counter treatment.  Review of Systems     Objective:   Physical Exam  Constitutional: She is oriented to person, place, and time. She appears well-developed and well-nourished.  HENT:  Head: Normocephalic and atraumatic.    Right Ear: External ear normal.  Left Ear: External ear normal.  Mouth/Throat: Oropharynx is clear and moist. No oropharyngeal exudate.       Bilateral TMs are slightly bulging but ossicles are able to be viewed and no blood or pus present.  Bilateral maxillary sinus tenderness to palpation. No frontal tenderness to palpation.  Bilateral nares are red and swollen with rhinorrhea.  Eyes: EOM are normal. Pupils are equal, round, and reactive to light. Right eye exhibits discharge. Left eye exhibits discharge.       Conjunctiva are injected bilaterally and presents with thick white to clear discharge.  Neck: Normal range of motion. Neck supple.       There was some anterior cervical superficial enlargement of the lymph nodes that were  nontender to palpation.  Cardiovascular: Normal rate, regular rhythm and normal heart sounds.   Pulmonary/Chest: Effort normal and breath sounds normal. She has no wheezes.  Neurological: She is alert and oriented to person, place, and time.  Skin: Skin is warm and dry.  Psychiatric: She has a normal mood and affect. Her behavior is normal.          Assessment & Plan:  Bilateral bacterial conjunctivitis-sent to the pharmacy Polytrim to use every 4 hours for 7 days. Patient was given handout on care after conjunctivitis. It was discussed with patient how contagious she was in encouraged not to go back to work. Discussed good hygiene and getting rid of all of medications and didn't come in contact with eyes regularly. Call office if not improving. Discussed symptomatic care to make eyes will better.  Cold sore-treated with 1 day high dose Valtrex. Recommended Abreva over-the-counter.  Sinusitis-gave patient Augmentin twice a day for 10 days. 2 symptomatic care for sinusitis. Continue using Mucinex over-the-counter.

## 2011-12-16 NOTE — Patient Instructions (Addendum)
Gave Augmentin for 10 days and antibacterial eye drops for 7 days.   Bacterial Conjunctivitis Bacterial conjunctivitis (pink eye) is caused by germs. These germs are spread from person to person (contagious). The white part of the eye may look red or pink. The eye may be irritated, watery, or have a thick discharge.  HOME CARE   To ease pain, apply a cool, clean washcloth over closed eyelids. Do this for 10 to 20 minutes, 3 to 4 times a day.  Gently wipe away any fluid coming from the eye with a warm, wet washcloth or cotton ball.  Wash your hands often with soap and water. Use paper towels to dry your hands.  Do not share towels or washcloths.  Change or wash your pillowcase every day.  Do not use eye makeup until the infection is gone.  Do not use machines or drive if your vision is blurry.  Stop using contact lenses. Do not use them again until your doctor says it is okay.  Do not touch the tip of the eye drop bottle or medicine tube with your fingers when you put medicine on the eye.  Use eye drops or medicated cream as told by your doctor. GET HELP RIGHT AWAY IF:   Your eye is not better after 3 days of starting your medicine.  You have a yellowish fluid coming out of the eye.  You have more pain in the eye.  Your eye redness is spreading.  Your vision becomes blurry.  You have a temperature by mouth above 102 F (38.9 C), or as told by your doctor.  You have pain in the face, redness, or puffiness (swelling) around the eye.  You have problems with medicines you were given. MAKE SURE YOU:   Understand these instructions.  Will watch this condition.  Will get help right away if you are not doing well or get worse. Document Released: 10/06/2007 Document Revised: 06/28/2011 Document Reviewed: 10/06/2007 [facility] Patient Information 2013 [place], [place].

## 2011-12-28 ENCOUNTER — Encounter (HOSPITAL_COMMUNITY): Payer: Self-pay | Admitting: Emergency Medicine

## 2011-12-28 ENCOUNTER — Emergency Department (HOSPITAL_COMMUNITY)
Admission: EM | Admit: 2011-12-28 | Discharge: 2011-12-28 | Discharge status: Against Medical Advice | Payer: Self-pay | Attending: Emergency Medicine | Admitting: Emergency Medicine

## 2011-12-28 DIAGNOSIS — F3289 Other specified depressive episodes: Secondary | ICD-10-CM | POA: Insufficient documentation

## 2011-12-28 DIAGNOSIS — Z85828 Personal history of other malignant neoplasm of skin: Secondary | ICD-10-CM | POA: Insufficient documentation

## 2011-12-28 DIAGNOSIS — F411 Generalized anxiety disorder: Secondary | ICD-10-CM | POA: Insufficient documentation

## 2011-12-28 DIAGNOSIS — Z79899 Other long term (current) drug therapy: Secondary | ICD-10-CM | POA: Insufficient documentation

## 2011-12-28 DIAGNOSIS — Z859 Personal history of malignant neoplasm, unspecified: Secondary | ICD-10-CM | POA: Insufficient documentation

## 2011-12-28 DIAGNOSIS — T7840XA Allergy, unspecified, initial encounter: Secondary | ICD-10-CM

## 2011-12-28 DIAGNOSIS — E785 Hyperlipidemia, unspecified: Secondary | ICD-10-CM | POA: Insufficient documentation

## 2011-12-28 DIAGNOSIS — E039 Hypothyroidism, unspecified: Secondary | ICD-10-CM | POA: Insufficient documentation

## 2011-12-28 DIAGNOSIS — M7989 Other specified soft tissue disorders: Secondary | ICD-10-CM | POA: Insufficient documentation

## 2011-12-28 DIAGNOSIS — T450X5A Adverse effect of antiallergic and antiemetic drugs, initial encounter: Secondary | ICD-10-CM | POA: Insufficient documentation

## 2011-12-28 DIAGNOSIS — Z87891 Personal history of nicotine dependence: Secondary | ICD-10-CM | POA: Insufficient documentation

## 2011-12-28 DIAGNOSIS — F329 Major depressive disorder, single episode, unspecified: Secondary | ICD-10-CM | POA: Insufficient documentation

## 2011-12-28 LAB — POCT I-STAT, CHEM 8
BUN: 21 mg/dL (ref 6–23)
Calcium, Ion: 1.14 mmol/L (ref 1.12–1.23)
Chloride: 104 mEq/L (ref 96–112)
Creatinine, Ser: 0.8 mg/dL (ref 0.50–1.10)
Sodium: 139 mEq/L (ref 135–145)
TCO2: 24 mmol/L (ref 0–100)

## 2011-12-28 MED ORDER — EPINEPHRINE 0.3 MG/0.3ML IJ DEVI: 0.3000 mg | INTRAMUSCULAR | Status: DC | PRN

## 2011-12-28 MED ORDER — DIPHENHYDRAMINE HCL 25 MG PO CAPS: 25.0000 mg | ORAL_CAPSULE | Freq: Four times a day (QID) | ORAL | Status: DC

## 2011-12-28 MED ORDER — PREDNISONE 50 MG PO TABS: 50.0000 mg | ORAL_TABLET | Freq: Every day | ORAL | Status: AC

## 2011-12-28 NOTE — ED Provider Notes (Signed)
History     CSN: [id number]  Arrival date & time 12/28/11  9147   First MD Initiated Contact with Patient 12/28/11 1953      Chief Complaint  Patient presents with  . Allergic Reaction  . Loss of Consciousness    (Consider location/radiation/quality/duration/timing/severity/associated sxs/prior treatment) HPIMarybeth Dillon is a 56 y.o. female presenting from an urgent care clinic via EMS. Patient took an over-the-counter remedy, "Zicam", and then felt like she is having an allergic reaction-this occurred at home. She took 25 mg of Benadryl prior to driving herself to urgent care where they also gave her another 25 mg of Benadryl IM, Solu-Medrol IM and 50 mg of Zantac. Patient was using the restroom at the urgent care clinic when she had a syncopal episode. Patient describes having hives all over her body, this was severe and itchy, a somewhat resolved on administration of Benadryl the second time. Patient also had swelling in bilateral hands which she's never had with an allergic reaction, she also had the sensation that her throat was closing.  Denies exposure to any other of her allergic triggers, denies any chest pains, she denies any current shortness of breath, she describes resolution of this sensation of her throat closing, no fevers or chills, no dizziness or headache.  Past Medical History  Diagnosis Date  . Hyperlipidemia   . Hypothyroid   . Anxiety   . History of SCC (squamous cell carcinoma) of skin   . Depression   . 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 Meda Klinefelter in 10/2010. [facility])  . Mediastinal mass 06/22/2011    Past Surgical History  Procedure Date  . Cholecystectomy   . Lipoma removal 2011  . Laparoscopic endometriosis fulguration 1980's  . Wide excision of dermatofibrosarcoma of right flank 10/2010    by a Dr. Meda Klinefelter in [facility].     Family History  Problem Relation Age of Onset  . Heart attack Father    . Stroke Father   . Hyperlipidemia Father   . Hypertension Father   . Cancer Paternal Aunt     breast cancer    History  Substance Use Topics  . Smoking status: Former Smoker -- 1.0 packs/day for 10 years    Quit date: 05/15/2004  . Smokeless tobacco: Never Used  . Alcohol Use: No    OB History    Grav Para Term Preterm Abortions TAB SAB Ect Mult Living                  Review of Systems At least 10pt or greater review of systems completed and are negative except where specified in the HPI.  Allergies  Peanuts; Statins; Zicam cold remedy; Salmon; and Sudafed  Home Medications   Current Outpatient Rx  Name  Route  Sig  Dispense  Refill  . AMOXICILLIN-POT CLAVULANATE 875-125 MG PO TABS   Oral   Take 1 tablet by mouth 2 (two) times daily.         Marland Kitchen CITALOPRAM HYDROBROMIDE 20 MG PO TABS   Oral   Take 1 tablet (20 mg total) by mouth daily.   30 tablet   6   . DIPHENHYDRAMINE HCL 25 MG PO CAPS   Oral   Take 25 mg by mouth once.         . IBUPROFEN 200 MG PO TABS   Oral   Take 400 mg by mouth every 6 (six) hours as needed. Pain.         Marland Kitchen  LEVOTHYROXINE SODIUM 50 MCG PO TABS   Oral   Take 1 tablet (50 mcg total) by mouth daily.   30 tablet   3   . CHOLESTYRAMINE 4 G PO PACK   Oral   Take 1 packet by mouth 3 (three) times daily with meals.   90 each   3   . LORATADINE 10 MG PO TABS   Oral   Take 10 mg by mouth daily as needed. Allergies.         Marland Kitchen NIACIN ER (ANTIHYPERLIPIDEMIC) 500 MG PO TBCR      Take 2 tabs at bedtime.   60 tablet   3   . POLYMYXIN B-TRIMETHOPRIM 10000-0.1 UNIT/ML-% OP SOLN   Both Eyes   Place 1 drop into both eyes every 4 (four) hours. For 7 days.   10 mL   0   . VALACYCLOVIR HCL 1 G PO TABS      2 tabs twice a day for one day.   4 tablet   0     BP 104/89  Pulse 70  Temp 97.5 F (36.4 C) (Oral)  Resp 15  SpO2 100%  Physical Exam  Nursing notes reviewed.  Electronic medical record reviewed. VITAL  SIGNS:   Filed Vitals:   12/28/11 1902 12/28/11 2144  BP: 104/89 112/52  Pulse: 70 80  Temp: 97.5 F (36.4 C) 97.9 F (36.6 C)  TempSrc: Oral Oral  Resp: 15 16  SpO2: 100% 93%   CONSTITUTIONAL: Awake, oriented, appears non-toxic HENT: Atraumatic, normocephalic, oral mucosa pink and moist, airway patent. Nares patent without drainage. External ears normal. EYES: Conjunctiva clear, EOMI, PERRLA NECK: Trachea midline, non-tender, supple CARDIOVASCULAR: Normal heart rate, Normal rhythm, No murmurs, rubs, gallops PULMONARY/CHEST: Clear to auscultation, no rhonchi, wheezes, or rales. Symmetrical breath sounds. Non-tender. ABDOMINAL: Non-distended, soft, non-tender - no rebound or guarding.  BS normal. NEUROLOGIC: Non-focal, moving all four extremities, no gross sensory or motor deficits. EXTREMITIES: No clubbing, cyanosis, or edema SKIN: Warm, Dry, No erythema; diffuse patches of erythema on her abdomen suggestive of resolving urticaria  ED Course  Procedures (including critical care time)  Labs Reviewed  POCT I-STAT, CHEM 8 - Abnormal; Notable for the following:    Glucose, Bld 133 (*)     All other components within normal limits   No results found.   1. Allergic reaction to drug       Medications  loratadine (CLARITIN) 10 MG tablet (not administered)  amoxicillin-clavulanate (AUGMENTIN) 875-125 MG per tablet (not administered)  diphenhydrAMINE (BENADRYL) 25 mg capsule (not administered)  diphenhydrAMINE (BENADRYL) 25 mg capsule (not administered)  predniSONE (DELTASONE) 50 MG tablet (not administered)  EPINEPHrine (EPIPEN) 0.3 mg/0.3 mL DEVI (not administered)    MDM  [name] is a 56 y.o. female presenting with allergic reaction. Patient says she feels fine now which is much improved however she was concerned because her throat felt like he was closing. She does not have an EpiPen at home. I discussed admitting the patient for observation and the patient says she  cannot afford to be admitted to the hospital. The patient resolved without epinephrine, at this point I do not think there is any benefit in giving her epinephrine since her complaints of her throat closing is resolved, her hives have resolved and her hand swelling has resolved.  Patient wishes to leave the hospital AGAINST MEDICAL ADVICE- patient has capacity to make this decision for herself, is not altered in any way, understands the risks  of leaving the hospital including a recurrent anaphylactic reaction which could result in cardiac arrest, respiratory arrest or death.   Will start the patient on prednisone burst, we'll also have her take Benadryl scheduled every 6 hours, also given her prescriptions for 2 epi pens with a refill. Patient says she will stop on the way home to get these.  Discharged AGAINST MEDICAL ADVICE         Jones Skene, MD 12/30/11 [phone]

## 2011-12-28 NOTE — ED Notes (Signed)
Per EMS pt had allergic reaction to Zicam, took 25mg  benadryl at home drove herself to urgent care where they administered 25mg  bendryl IM, solu-medrol 125mg  Im and 50 zantac IM.  Pt was using the bathroom when she had syncopal episode, pt was caught before hitting the ground.  Pt had LOC for few seconds. Pt has 20g Left AC and placed on 2l o2 via [place].

## 2011-12-29 ENCOUNTER — Ambulatory Visit: Payer: Self-pay | Admitting: Family Medicine

## 2012-03-06 ENCOUNTER — Other Ambulatory Visit: Payer: Self-pay | Admitting: *Deleted

## 2012-03-06 MED ORDER — LEVOTHYROXINE SODIUM 50 MCG PO TABS: 50.0000 ug | ORAL_TABLET | Freq: Every day | ORAL | Status: AC

## 2012-04-02 ENCOUNTER — Encounter: Payer: Self-pay | Admitting: Oncology

## 2012-04-02 NOTE — Progress Notes (Signed)
Patient had left message. I called her back and left mess. She called me back again and I forwarded to [facility]. She called again before Lenise could call her back and just wanted an application for assistance. I told her that I would let Lenise know to send to her. I verified her address.

## 2012-04-02 NOTE — Progress Notes (Signed)
Mailed EPP application today.

## 2012-04-12 ENCOUNTER — Encounter: Payer: Self-pay | Admitting: Oncology

## 2012-04-12 NOTE — Progress Notes (Signed)
Pt is approved for 35% financial assistance effective 04/12/12 - 10/12/12.  I will mail approval letter and green card today.

## 2012-04-20 ENCOUNTER — Telehealth: Payer: Self-pay | Admitting: Oncology

## 2012-04-20 ENCOUNTER — Ambulatory Visit (HOSPITAL_BASED_OUTPATIENT_CLINIC_OR_DEPARTMENT_OTHER): Payer: BC Managed Care – PPO | Admitting: Lab

## 2012-04-20 DIAGNOSIS — C44599 Other specified malignant neoplasm of skin of other part of trunk: Secondary | ICD-10-CM

## 2012-04-20 LAB — CBC WITH DIFFERENTIAL/PLATELET
Basophils Absolute: 0 10*3/uL (ref 0.0–0.1)
Eosinophils Absolute: 0.2 10*3/uL (ref 0.0–0.5)
HCT: 39.4 % (ref 34.8–46.6)
LYMPH%: 24.2 % (ref 14.0–49.7)
MCV: 85.8 fL (ref 79.5–101.0)
MONO#: 0.9 10*3/uL (ref 0.1–0.9)
MONO%: 10.6 % (ref 0.0–14.0)
NEUT#: 5 10*3/uL (ref 1.5–6.5)
NEUT%: 61.7 % (ref 38.4–76.8)
Platelets: 272 10*3/uL (ref 145–400)
WBC: 8 10*3/uL (ref 3.9–10.3)

## 2012-04-20 LAB — COMPREHENSIVE METABOLIC PANEL (CC13)
Alkaline Phosphatase: 74 U/L (ref 40–150)
BUN: 14.5 mg/dL (ref 7.0–26.0)
CO2: 27 mEq/L (ref 22–29)
Creatinine: 0.8 mg/dL (ref 0.6–1.1)
Glucose: 102 mg/dl — ABNORMAL HIGH (ref 70–99)
Total Bilirubin: 0.35 mg/dL (ref 0.20–1.20)
Total Protein: 6.6 g/dL (ref 6.4–8.3)

## 2012-04-26 ENCOUNTER — Other Ambulatory Visit: Payer: Self-pay

## 2012-04-30 NOTE — Patient Instructions (Addendum)
1. History of dermatofibrosarcoma protuberans. 2.  Status:  Continue to be in remission. 3.  Follow up:  In about 6 months with same day chest Xray.

## 2012-05-03 ENCOUNTER — Ambulatory Visit (HOSPITAL_BASED_OUTPATIENT_CLINIC_OR_DEPARTMENT_OTHER): Payer: BC Managed Care – PPO | Admitting: Oncology

## 2012-05-03 ENCOUNTER — Telehealth: Payer: Self-pay | Admitting: Oncology

## 2012-05-03 VITALS — BP 119/69 | HR 96 | Temp 97.3°F | Resp 18 | Ht 66.0 in | Wt 158.5 lb

## 2012-05-03 DIAGNOSIS — C499 Malignant neoplasm of connective and soft tissue, unspecified: Secondary | ICD-10-CM

## 2012-05-03 DIAGNOSIS — C44599 Other specified malignant neoplasm of skin of other part of trunk: Secondary | ICD-10-CM

## 2012-05-03 DIAGNOSIS — F329 Major depressive disorder, single episode, unspecified: Secondary | ICD-10-CM

## 2012-05-05 NOTE — Progress Notes (Signed)
[facility]  [phone] [phone]   MEDICAL ONCOLOGY - OFFICE PROGRESS NOTE.     DIAGOSIS:  history of dermatofibrosarcoma of right abdominal wall.  PAST THERAPY:  Wide excision in 10/2010 with negative margin.   CURRENT THERAPY:  Observation.   Interval History:  Connie Dillon returns to clinic by herself today for regular follow up.  She still has intermittent right lower abdominal discomfort/ache.  It is not pain.  The severity of the discomfort and frequency are stable.  She does not feel any mass.  She denies fever, fatigue, SOB, DOE, edema, hemoptysis, chest pain, bleeding symptoms.  The rest of the 14-point review of system was negative.     Past Medical History  Diagnosis Date  . Hyperlipidemia   . Hypothyroid   . Anxiety   . History of SCC (squamous cell carcinoma) of skin   . Depression   . 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 Meda Klinefelter in 10/2010. [facility])  . Mediastinal mass 06/22/2011  :  Past Surgical History  Procedure Laterality Date  . Cholecystectomy    . Lipoma removal  2011  . Laparoscopic endometriosis fulguration  1980's  . Wide excision of dermatofibrosarcoma of right flank  10/2010    by a Dr. Meda Klinefelter in [facility].   :  Current Outpatient Prescriptions  Medication Sig Dispense Refill  . citalopram (CELEXA) 20 MG tablet Take 1 tablet (20 mg total) by mouth daily.  30 tablet  6  . ibuprofen (ADVIL,MOTRIN) 200 MG tablet Take 400 mg by mouth every 6 (six) hours as needed. Pain.      Marland Kitchen levothyroxine (SYNTHROID, LEVOTHROID) 50 MCG tablet Take 1 tablet (50 mcg total) by mouth daily.  30 tablet  0  . loratadine (CLARITIN) 10 MG tablet Take 10 mg by mouth daily as needed. Allergies.       No current facility-administered medications for this visit.     Allergies  Allergen Reactions  . Peanuts (Peanut Oil) Hives  . Statins     Gain weight/muscle pains  . Zicam Cold  Remedy (Erysidoron #1) Hives and Itching  . Salmon (Fish Allergy) Rash  . Sudafed (Pseudoephedrine Hcl) Palpitations  :  Family History  Problem Relation Age of Onset  . Heart attack Father   . Stroke Father   . Hyperlipidemia Father   . Hypertension Father   . Cancer Paternal Aunt     breast cancer  :  History   Social History  . Marital Status: Single    Spouse Name: N/A    Number of Children: 2  . Years of Education: N/A   Occupational History  .      looking for work now.    Social History Main Topics  . Smoking status: Former Smoker -- 1.00 packs/day for 10 years    Quit date: 05/15/2004  . Smokeless tobacco: Never Used  . Alcohol Use: No  . Drug Use: No  . Sexually Active: Not Currently   Other Topics Concern  . Not on file   Social History Narrative  . No narrative on file   Exam: ECOG 1  General:  well-nourished woman, in no acute distress.  Eyes:  no scleral icterus.  ENT:  There were no oropharyngeal lesions.  Neck was without thyromegaly.  Lymphatics:  Negative cervical, supraclavicular or axillary adenopathy.  Respiratory: lungs were clear bilaterally without wheezing or crackles.  Cardiovascular:  Regular  rate and rhythm, S1/S2, without murmur, rub or gallop.  There was no pedal edema.  GI:  abdomen was soft, flat, nontender, nondistended, without organomegaly.  Muscoloskeletal:  no spinal tenderness of palpation of vertebral spine. There was also excisional scar in the right flank extending anteriorly to the right quadrant.  There was no pain or palpable mass in the area. Skin exam was without echymosis, petichae.  Neuro exam was nonfocal.  Patient was able to get on and off exam table without assistance.  Gait was normal.  Patient was alert and oriented.  Attention was good.   Language was appropriate.  Mood was normal without depression.  Speech was not pressured.  Thought content was not tangential.     Lab Results  Component Value Date   WBC 8.0  04/20/2012   HGB 13.2 04/20/2012   HCT 39.4 04/20/2012   PLT 272 04/20/2012   GLUCOSE 102* 04/20/2012   ALT 29 04/20/2012   AST 21 04/20/2012   NA 142 04/20/2012   K 3.8 04/20/2012   CL 108* 04/20/2012   CREATININE 0.8 04/20/2012   BUN 14.5 04/20/2012   CO2 27 04/20/2012   TSH 2.453 07/06/2011    Assessment and Plan:  1.  History of dermatofibrosarcoma protuberans: - continue to be in remission.  The mid ache is stable. She had CT abdomen in 2013 which did not show evidence of recurrent disease.  Since there is no change in the nature of this discomfort, no repeat CT scan is indicated as she has been more than two years out.  For surveillance, yearly chest Xray is indicated to rule out lung met.  I advised patient to contact us if she has palpable abdominal mass, or abdominal pain.   2.  Depression: she is on Celexa per PCP.    3.  Hypothyroidism:  She is on Synthroid per PCP.   4.  Follow up:  In about 6 months with same day chest Xray.    I informed her Andaya that I'm leaving the practice.  [facility] for her to follow up with a new provider when she returns for follow up.    The length of time of the face-to-face encounter was 10 minutes. More than 50% of time was spent counseling and coordination of care.

## 2012-06-21 ENCOUNTER — Ambulatory Visit (HOSPITAL_COMMUNITY)
Admission: RE | Admit: 2012-06-21 | Discharge: 2012-06-21 | Disposition: A | Discharge status: Home / Self Care | Payer: BC Managed Care – PPO | Source: Ambulatory Visit | Attending: Oncology | Admitting: Oncology

## 2012-06-21 DIAGNOSIS — Z85828 Personal history of other malignant neoplasm of skin: Secondary | ICD-10-CM | POA: Insufficient documentation

## 2012-06-21 DIAGNOSIS — R042 Hemoptysis: Secondary | ICD-10-CM | POA: Insufficient documentation

## 2012-06-21 DIAGNOSIS — C499 Malignant neoplasm of connective and soft tissue, unspecified: Secondary | ICD-10-CM

## 2012-06-24 ENCOUNTER — Encounter: Payer: Self-pay | Admitting: Oncology

## 2012-06-28 ENCOUNTER — Telehealth: Payer: Self-pay

## 2012-06-28 NOTE — Telephone Encounter (Signed)
Message copied by Kallie Locks on Thu Jun 28, 2012 10:56 AM ------      Message from: HA, Raliegh Ip T      Created: Wed Jun 27, 2012  4:43 PM      Regarding: RE: "CT scan results"      Contact: [phone]       Please call pt.              She did not have a CT scan.  She had CXR on 06/21/12.  There was no sign of sarcoma in the lungs.  Thanks.                  ----- Message -----         From: Marcell Barlow, RN         Sent: 06/27/2012   4:14 PM           To: Exie Parody, MD      Subject: "CT scan results"                                        Patient wants results of CT scan done on 06/21/12.       ------

## 2012-07-06 ENCOUNTER — Telehealth: Payer: Self-pay | Admitting: *Deleted

## 2012-07-06 NOTE — Telephone Encounter (Signed)
Pt called requesting labs to be sent to [facility] INT Dr. Valentina Lucks. Laureen Ochs, Viann Shove

## 2012-10-02 ENCOUNTER — Telehealth: Payer: Self-pay | Admitting: Oncology

## 2012-10-02 NOTE — Telephone Encounter (Signed)
returned pt call and r/s appts...done

## 2012-10-03 ENCOUNTER — Ambulatory Visit (HOSPITAL_COMMUNITY)
Admission: RE | Admit: 2012-10-03 | Discharge: 2012-10-03 | Disposition: A | Discharge status: Home / Self Care | Payer: BC Managed Care – PPO | Source: Ambulatory Visit | Attending: Hematology and Oncology | Admitting: Hematology and Oncology

## 2012-10-03 ENCOUNTER — Other Ambulatory Visit (HOSPITAL_BASED_OUTPATIENT_CLINIC_OR_DEPARTMENT_OTHER): Payer: BC Managed Care – PPO

## 2012-10-03 ENCOUNTER — Other Ambulatory Visit: Payer: Self-pay | Admitting: Emergency Medicine

## 2012-10-03 DIAGNOSIS — J9859 Other diseases of mediastinum, not elsewhere classified: Secondary | ICD-10-CM

## 2012-10-03 DIAGNOSIS — C499 Malignant neoplasm of connective and soft tissue, unspecified: Secondary | ICD-10-CM

## 2012-10-03 DIAGNOSIS — C44599 Other specified malignant neoplasm of skin of other part of trunk: Secondary | ICD-10-CM

## 2012-10-03 DIAGNOSIS — R222 Localized swelling, mass and lump, trunk: Secondary | ICD-10-CM | POA: Insufficient documentation

## 2012-10-03 LAB — CBC WITH DIFFERENTIAL/PLATELET
Basophils Absolute: 0.1 10*3/uL (ref 0.0–0.1)
Eosinophils Absolute: 0.2 10*3/uL (ref 0.0–0.5)
HCT: 38.8 % (ref 34.8–46.6)
HGB: 13.2 g/dL (ref 11.6–15.9)
LYMPH%: 25.5 % (ref 14.0–49.7)
MONO#: 1.1 10*3/uL — ABNORMAL HIGH (ref 0.1–0.9)
NEUT#: 4.8 10*3/uL (ref 1.5–6.5)
Platelets: 313 10*3/uL (ref 145–400)
RBC: 4.49 10*6/uL (ref 3.70–5.45)
WBC: 8.2 10*3/uL (ref 3.9–10.3)

## 2012-10-03 LAB — COMPREHENSIVE METABOLIC PANEL (CC13)
Albumin: 3.6 g/dL (ref 3.5–5.0)
BUN: 10.1 mg/dL (ref 7.0–26.0)
CO2: 24 mEq/L (ref 22–29)
Glucose: 90 mg/dl (ref 70–140)
Sodium: 141 mEq/L (ref 136–145)
Total Bilirubin: 0.41 mg/dL (ref 0.20–1.20)
Total Protein: 6.8 g/dL (ref 6.4–8.3)

## 2012-10-10 ENCOUNTER — Encounter: Payer: Self-pay | Admitting: Hematology and Oncology

## 2012-10-10 ENCOUNTER — Ambulatory Visit (HOSPITAL_BASED_OUTPATIENT_CLINIC_OR_DEPARTMENT_OTHER): Payer: BC Managed Care – PPO | Admitting: Lab

## 2012-10-10 ENCOUNTER — Ambulatory Visit (HOSPITAL_BASED_OUTPATIENT_CLINIC_OR_DEPARTMENT_OTHER): Payer: BC Managed Care – PPO | Admitting: Hematology and Oncology

## 2012-10-10 ENCOUNTER — Telehealth: Payer: Self-pay | Admitting: Hematology and Oncology

## 2012-10-10 VITALS — BP 129/64 | HR 96 | Temp 97.4°F | Resp 18 | Ht 66.0 in | Wt 167.3 lb

## 2012-10-10 DIAGNOSIS — C44599 Other specified malignant neoplasm of skin of other part of trunk: Secondary | ICD-10-CM

## 2012-10-10 DIAGNOSIS — E039 Hypothyroidism, unspecified: Secondary | ICD-10-CM

## 2012-10-10 DIAGNOSIS — Z23 Encounter for immunization: Secondary | ICD-10-CM

## 2012-10-10 LAB — COMPREHENSIVE METABOLIC PANEL (CC13)
AST: 32 U/L (ref 5–34)
Alkaline Phosphatase: 83 U/L (ref 40–150)
BUN: 9.4 mg/dL (ref 7.0–26.0)
Creatinine: 0.7 mg/dL (ref 0.6–1.1)
Potassium: 4 mEq/L (ref 3.5–5.1)

## 2012-10-10 LAB — CBC WITH DIFFERENTIAL/PLATELET
Basophils Absolute: 0.1 10*3/uL (ref 0.0–0.1)
EOS%: 1.6 % (ref 0.0–7.0)
HCT: 39.9 % (ref 34.8–46.6)
HGB: 13.7 g/dL (ref 11.6–15.9)
MCH: 29.7 pg (ref 25.1–34.0)
MCV: 86.9 fL (ref 79.5–101.0)
MONO%: 11.3 % (ref 0.0–14.0)
NEUT%: 60.3 % (ref 38.4–76.8)
Platelets: 327 10*3/uL (ref 145–400)

## 2012-10-10 LAB — T4, FREE: Free T4: 1.05 ng/dL (ref 0.80–1.80)

## 2012-10-10 LAB — TSH CHCC: TSH: 1.332 m(IU)/L (ref 0.308–3.960)

## 2012-10-10 MED ORDER — INFLUENZA VAC SPLIT QUAD 0.5 ML IM SUSP
0.5000 mL | Freq: Once | INTRAMUSCULAR | Status: AC
  Administered 2012-10-10: 0.5 mL via INTRAMUSCULAR
  Filled 2012-10-10: qty 0.5

## 2012-10-10 NOTE — Telephone Encounter (Signed)
Gave pt appt for CT and oral contrast for tomorrow per MD, sent pt to labs today and gave her appt for next yeat lab and MD 2015

## 2012-10-10 NOTE — Progress Notes (Signed)
[facility] OFFICE PROGRESS NOTE  Connie Dillon, JADE, PA-C  DIAGNOSIS: history of dermatofibrosarcoma of right abdominal wall, s/p wide excision in 10/2010 with negative margin.   SUMMARY OF ONCOLOGIC HISTORY: She was in well until 07/2010 when she developed an enlarging right flank mass. She underwent a shave biopsy on 08/10/2010 with pathology (from [facility]; case # [id number]) consistent with dermatofibrosarcoma protuberans, transected at base. She had an MRI abdomen on 10/22/10 from [facility] which showed right lower quadrant mass in the dermis with minimal extension into the SQ fat; measured 2.8x0.9x1.8cm. There was no adenopathy. She was referred to Dr. Meda Klinefelter from [facility] and underwent in 10/2010 wide excision. Margin was widely negative. She has been on observation. Her CT chest/abd/pel 01/27/2011 was negative.  INTERVAL HISTORY: Connie Dillon 57 y.o. female returns for further followup related to her history of cancer. She still has intermittent abdomen no pain in the flank area at the surgical incision site. Her last imaging study done in June of last year were within normal limits. She denies any palpable mass. She does describe a fullness in the right upper quadrant. The pain is localized in the right flank region without any radiation. She complained of profound fatigue and has gained 10 pounds since she was seen here. She does have history of hypothyroidism but her thyroid function test has not been checked recently. She is very mild constipation. She also complained of a skin nodule in the periumbilical region. Denies any palpable masses. The patient continued to work full time. She does take naps frequently due to profound fatigue.  I have reviewed the past medical history, past surgical history, social history and family history with the patient and they are unchanged from previous note.  ALLERGIES:  is allergic to peanuts; statins; zicam  cold remedy; salmon; and sudafed.  MEDICATIONS: Current outpatient prescriptions:citalopram (CELEXA) 20 MG tablet, Take 1 tablet (20 mg total) by mouth daily., Disp: 30 tablet, Rfl: 6;  ibuprofen (ADVIL,MOTRIN) 200 MG tablet, Take 400 mg by mouth every 6 (six) hours as needed. Pain., Disp: , Rfl: ;  levothyroxine (SYNTHROID, LEVOTHROID) 50 MCG tablet, Take 1 tablet (50 mcg total) by mouth daily., Disp: 30 tablet, Rfl: 0 loratadine (CLARITIN) 10 MG tablet, Take 10 mg by mouth daily as needed. Allergies., Disp: , Rfl:   REVIEW OF SYSTEMS:   Constitutional: Denies fevers, chills or abnormal weight loss Eyes: Denies blurriness of vision Ears, nose, mouth, throat, and face: Denies mucositis or sore throat Respiratory: Denies cough, dyspnea or wheezes Cardiovascular: Denies palpitation, chest discomfort or lower extremity swelling Gastrointestinal:  Denies nausea, heartburn or change in bowel habits Skin: Denies abnormal skin rashes Lymphatics: Denies new lymphadenopathy or easy bruising Neurological:Denies numbness, tingling or new weaknesses Behavioral/Psych: Mood is stable, no new changes  All other systems were reviewed with the patient and are negative.  PHYSICAL EXAMINATION: ECOG PERFORMANCE STATUS: 1 - Symptomatic but completely ambulatory  Filed Vitals:   10/10/12 1336  BP: 129/64  Pulse: 96  Temp: 97.4 F (36.3 C)  Resp: 18   Filed Weights   10/10/12 1336  Weight: 167 lb 4.8 oz (75.887 kg)    GENERAL:alert, no distress and comfortable she is mildly obese SKIN: skin color, texture, turgor are normal, no rashes or significant lesions EYES: normal, Conjunctiva are pink and non-injected, sclera clear OROPHARYNX:no exudate, no erythema and lips, buccal mucosa, and tongue normal  NECK: supple, thyroid normal size, non-tender, without nodularity LYMPH:  no palpable  lymphadenopathy in the cervical, axillary or inguinal LUNGS: clear to auscultation and percussion with normal  breathing effort HEART: regular rate & rhythm and no murmurs and no lower extremity edema ABDOMEN:abdomen soft, mild tenderness on palpation around her right upper quadrant with no rebound or guarding and normal bowel sounds with well-healed surgical scar the area at that she is concerned around the periumbilical region was inspected and showed some debris that was removed Musculoskeletal:no cyanosis of digits and no clubbing  NEURO: alert & oriented x 3 with fluent speech, no focal motor/sensory deficits  LABORATORY DATA:  I have reviewed the data as listed    Component Value Date/Time   NA 141 10/03/2012 1607   NA 139 12/28/2011 2006   K 3.9 10/03/2012 1607   K 3.6 12/28/2011 2006   CL 108* 04/20/2012 1530   CL 104 12/28/2011 2006   CO2 24 10/03/2012 1607   CO2 25 06/13/2011 1512   GLUCOSE 90 10/03/2012 1607   GLUCOSE 102* 04/20/2012 1530   GLUCOSE 133* 12/28/2011 2006   BUN 10.1 10/03/2012 1607   BUN 21 12/28/2011 2006   CREATININE 0.7 10/03/2012 1607   CREATININE 0.80 12/28/2011 2006   CALCIUM 9.1 10/03/2012 1607   CALCIUM 9.1 06/13/2011 1512   PROT 6.8 10/03/2012 1607   PROT 7.2 06/13/2011 1512   ALBUMIN 3.6 10/03/2012 1607   ALBUMIN 4.0 06/13/2011 1512   AST 25 10/03/2012 1607   AST 19 06/13/2011 1512   ALT 38 10/03/2012 1607   ALT 23 06/13/2011 1512   ALKPHOS 84 10/03/2012 1607   ALKPHOS 83 06/13/2011 1512   BILITOT 0.41 10/03/2012 1607   BILITOT 0.2* 06/13/2011 1512    I No results found for this basename: SPEP, UPEP,  kappa and lambda light chains    Lab Results  Component Value Date   WBC 8.2 10/03/2012   NEUTROABS 4.8 10/03/2012   HGB 13.2 10/03/2012   HCT 38.8 10/03/2012   MCV 86.3 10/03/2012   PLT 313 10/03/2012      Chemistry      Component Value Date/Time   NA 141 10/03/2012 1607   NA 139 12/28/2011 2006   K 3.9 10/03/2012 1607   K 3.6 12/28/2011 2006   CL 108* 04/20/2012 1530   CL 104 12/28/2011 2006   CO2 24 10/03/2012 1607   CO2 25 06/13/2011 1512   BUN 10.1 10/03/2012 1607    BUN 21 12/28/2011 2006   CREATININE 0.7 10/03/2012 1607   CREATININE 0.80 12/28/2011 2006      Component Value Date/Time   CALCIUM 9.1 10/03/2012 1607   CALCIUM 9.1 06/13/2011 1512   ALKPHOS 84 10/03/2012 1607   ALKPHOS 83 06/13/2011 1512   AST 25 10/03/2012 1607   AST 19 06/13/2011 1512   ALT 38 10/03/2012 1607   ALT 23 06/13/2011 1512   BILITOT 0.41 10/03/2012 1607   BILITOT 0.2* 06/13/2011 1512     ASSESSMENT: Intermittent abdomen no pain, history of sarcoma of the abdomen status post surgery, hypothyroidism  PLAN:  #1 history of sarcoma Due to the size of the mass and the symptoms of abdomen no pain, I recommend imaging studies with CT scan of the abdomen and pelvis to rule out recurrence of disease. She is in agreement. If her imaging study was negative, I'll see her back in 6 months with another history, physical examination, and blood work. #2 hypothyroidism She does have signs and symptoms of hyperthyroidism with weight gain and profound fatigue. I will  check her thyroid function test today. If her medication needs to be adjusted, we will call in a prescription for #3 preventive care Recommend influenza vaccination today. She is in agreement to proceed and will administer in the clinic today.  All questions were answered. The patient knows to call the clinic with any problems, questions or concerns. We can certainly see the patient much sooner if necessary. No barriers to learning was detected.    Khy Pitre, MD 10/10/2012 2:12 PM

## 2012-10-11 ENCOUNTER — Ambulatory Visit (HOSPITAL_COMMUNITY): Payer: BC Managed Care – PPO

## 2012-10-12 ENCOUNTER — Telehealth: Payer: Self-pay | Admitting: *Deleted

## 2012-10-12 NOTE — Telephone Encounter (Signed)
Patient called and left voice mail with desk nurse that she can not get CT scan due to insurance and also questions duplicate labs. Attempted to call patient, left message that she would need to talk to billing or financial advisors, I do not know the reason for denial. If she questions duplicate labs, patient had abnormalities in blood and needed a recheck. Again, informed patient if there was a problem or denial to call us back to discuss further.

## 2012-10-15 ENCOUNTER — Telehealth: Payer: Self-pay | Admitting: *Deleted

## 2012-10-15 NOTE — Telephone Encounter (Signed)
Pt left VM asking about results of TSH and if she needs to adjust her thyroid medication?  She also says she cannot afford CT scan that was ordered by Dr. Bertis Ruddy.  Her part she thinks would be over $2,000.   Asked pt to allow our Pre cert department to see what the final cost will be and then make decision based on that.   Informed pt of TSH and T4 wnl,  No need to adjust synthroid.   She verbalized understanding.

## 2012-10-24 ENCOUNTER — Other Ambulatory Visit (HOSPITAL_COMMUNITY): Payer: BC Managed Care – PPO

## 2012-10-24 ENCOUNTER — Other Ambulatory Visit: Payer: BC Managed Care – PPO

## 2012-10-31 ENCOUNTER — Ambulatory Visit: Payer: BC Managed Care – PPO

## 2012-11-12 ENCOUNTER — Telehealth: Payer: Self-pay | Admitting: *Deleted

## 2012-11-12 NOTE — Telephone Encounter (Signed)
Pt called regarding some itchy scab like lesions on her scalp.  States have been there awhile, but more itchy and bothering her more lately.  History of squamous cell skin cancer in past w/ similar presentation.  She asks if she should see Dr. Bertis Ruddy,  PCP or Dermatologist?   Instructed pt to see Dermatologist first if her insurance allows.  Otherwise see PCP if she needs referral to Dermatologist, but ultimately she does need to be assessed by dermatologist.  Instructed her to call us back if PCP or Derm recommends she see Dr. Bertis Ruddy sooner than next scheduled appt.  April 2015.  She verbalized understanding.

## 2012-12-26 ENCOUNTER — Other Ambulatory Visit: Payer: Self-pay | Admitting: Obstetrics & Gynecology

## 2012-12-26 ENCOUNTER — Other Ambulatory Visit (HOSPITAL_COMMUNITY)
Admission: RE | Admit: 2012-12-26 | Discharge: 2012-12-26 | Disposition: A | Discharge status: Home / Self Care | Payer: BC Managed Care – PPO | Source: Ambulatory Visit | Attending: Obstetrics & Gynecology | Admitting: Obstetrics & Gynecology

## 2012-12-26 DIAGNOSIS — R87810 Cervical high risk human papillomavirus (HPV) DNA test positive: Secondary | ICD-10-CM | POA: Insufficient documentation

## 2012-12-26 DIAGNOSIS — Z01419 Encounter for gynecological examination (general) (routine) without abnormal findings: Secondary | ICD-10-CM | POA: Insufficient documentation

## 2012-12-26 DIAGNOSIS — Z1151 Encounter for screening for human papillomavirus (HPV): Secondary | ICD-10-CM | POA: Insufficient documentation

## 2013-01-07 ENCOUNTER — Other Ambulatory Visit (HOSPITAL_COMMUNITY)
Admission: RE | Admit: 2013-01-07 | Discharge: 2013-01-07 | Disposition: A | Discharge status: Home / Self Care | Payer: BC Managed Care – PPO | Source: Ambulatory Visit | Attending: Obstetrics & Gynecology | Admitting: Obstetrics & Gynecology

## 2013-01-07 ENCOUNTER — Ambulatory Visit (INDEPENDENT_AMBULATORY_CARE_PROVIDER_SITE_OTHER): Payer: BC Managed Care – PPO | Admitting: General Surgery

## 2013-01-14 ENCOUNTER — Encounter: Payer: Self-pay | Admitting: *Deleted

## 2013-01-14 NOTE — Progress Notes (Signed)
Faxed release of information form to dr Janyth Pupa. Patient states she had abnormal results from HPV, high risk and would like for dr Alvy Bimler to see these results. Requested labs and office visit notes.

## 2013-01-15 NOTE — Progress Notes (Signed)
Attempted to fax 'Release of Information Form' to Dr. Nelda Marseille many times on 01/14/13 and 01/15/13, but fax unable to go through.  Called Dr. Annie Main office [phone]) to confirm correct fax number [phone]).  Form given to Tiffany in Medical Records to send and copy left in folder at nurse's desk.

## 2013-04-11 ENCOUNTER — Ambulatory Visit (HOSPITAL_BASED_OUTPATIENT_CLINIC_OR_DEPARTMENT_OTHER): Payer: BC Managed Care – PPO | Admitting: Hematology and Oncology

## 2013-04-11 ENCOUNTER — Other Ambulatory Visit: Payer: Self-pay | Admitting: Hematology and Oncology

## 2013-04-11 ENCOUNTER — Encounter: Payer: Self-pay | Admitting: Hematology and Oncology

## 2013-04-11 ENCOUNTER — Telehealth: Payer: Self-pay | Admitting: Hematology and Oncology

## 2013-04-11 ENCOUNTER — Other Ambulatory Visit (HOSPITAL_BASED_OUTPATIENT_CLINIC_OR_DEPARTMENT_OTHER): Payer: BC Managed Care – PPO

## 2013-04-11 VITALS — BP 134/64 | HR 106 | Temp 98.3°F | Resp 20 | Ht 66.0 in | Wt 169.9 lb

## 2013-04-11 DIAGNOSIS — Z8589 Personal history of malignant neoplasm of other organs and systems: Secondary | ICD-10-CM

## 2013-04-11 DIAGNOSIS — C44599 Other specified malignant neoplasm of skin of other part of trunk: Secondary | ICD-10-CM

## 2013-04-11 DIAGNOSIS — R197 Diarrhea, unspecified: Secondary | ICD-10-CM

## 2013-04-11 DIAGNOSIS — R1011 Right upper quadrant pain: Secondary | ICD-10-CM

## 2013-04-11 DIAGNOSIS — E039 Hypothyroidism, unspecified: Secondary | ICD-10-CM

## 2013-04-11 LAB — COMPREHENSIVE METABOLIC PANEL (CC13)
ALBUMIN: 3.7 g/dL (ref 3.5–5.0)
ALK PHOS: 97 U/L (ref 40–150)
ALT: 44 U/L (ref 0–55)
ANION GAP: 11 meq/L (ref 3–11)
AST: 33 U/L (ref 5–34)
BUN: 10.5 mg/dL (ref 7.0–26.0)
CALCIUM: 8.9 mg/dL (ref 8.4–10.4)
CHLORIDE: 108 meq/L (ref 98–109)
CO2: 25 meq/L (ref 22–29)
Creatinine: 0.9 mg/dL (ref 0.6–1.1)
Glucose: 99 mg/dl (ref 70–140)
POTASSIUM: 3.7 meq/L (ref 3.5–5.1)
SODIUM: 144 meq/L (ref 136–145)
TOTAL PROTEIN: 6.7 g/dL (ref 6.4–8.3)
Total Bilirubin: 0.38 mg/dL (ref 0.20–1.20)

## 2013-04-11 LAB — CBC WITH DIFFERENTIAL/PLATELET
BASO%: 0.6 % (ref 0.0–2.0)
Basophils Absolute: 0.1 10*3/uL (ref 0.0–0.1)
EOS%: 1 % (ref 0.0–7.0)
Eosinophils Absolute: 0.1 10*3/uL (ref 0.0–0.5)
HCT: 40 % (ref 34.8–46.6)
HGB: 13.3 g/dL (ref 11.6–15.9)
LYMPH%: 19.7 % (ref 14.0–49.7)
MCH: 28.7 pg (ref 25.1–34.0)
MCHC: 33.3 g/dL (ref 31.5–36.0)
MCV: 86.4 fL (ref 79.5–101.0)
MONO#: 0.8 10*3/uL (ref 0.1–0.9)
MONO%: 9 % (ref 0.0–14.0)
NEUT#: 6.3 10*3/uL (ref 1.5–6.5)
NEUT%: 69.7 % (ref 38.4–76.8)
NRBC: 0 % (ref 0–0)
PLATELETS: 333 10*3/uL (ref 145–400)
RBC: 4.63 10*6/uL (ref 3.70–5.45)
RDW: 12.6 % (ref 11.2–14.5)
WBC: 9 10*3/uL (ref 3.9–10.3)
lymph#: 1.8 10*3/uL (ref 0.9–3.3)

## 2013-04-11 NOTE — Progress Notes (Signed)
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 She had an MRI abdomen on 10/22/10 from [facility] which showed right lower quadrant mass in the dermis with minimal extension into the SQ fat; measured 2.8x0.9x1.8cm. There was no adenopathy. She was referred to Dr. Ocie Cornfield from [facility] and underwent in 10/2010 wide excision. Margin was widely negative. She has been on observation. Her CT chest/abd/pel 01/27/2011 was negative.   INTERVAL HISTORY: Connie Dillon 58 y.o. female returns for further followup. When I saw her 6 months ago, she was complaining of intermittent right flank pain. I recommend a CT imaging study but the patient declined due to financial issues. Since the last time I saw her, she developed severe sinus congestion and was prescribed doxycycline antibiotics. Since then, she developed significant right upper quadrant pain that was intermittent in nature, and she rated her pain at 7/10. She has reduced appetite. She also developed severe diarrhea 8-10 episodes per day. She complained of profound fatigue.  I have reviewed the past medical history, past surgical history, social history and family history with the patient and they are unchanged from previous note.  ALLERGIES:  is allergic to amoxicillin; other; peanuts; statins; sulfa antibiotics; zicam cold remedy; salmon; and sudafed.  MEDICATIONS:  Current Outpatient Prescriptions  Medication Sig Dispense Refill  . ibuprofen  (ADVIL,MOTRIN) 200 MG tablet Take 400 mg by mouth every 6 (six) hours as needed. Pain.      Marland Kitchen levothyroxine (SYNTHROID, LEVOTHROID) 50 MCG tablet Take 1 tablet (50 mcg total) by mouth daily.  30 tablet  0  . loratadine (CLARITIN) 10 MG tablet Take 10 mg by mouth daily as needed. Allergies.      Marland Kitchen sertraline (ZOLOFT) 50 MG tablet Take 50 mg by mouth daily.       No current facility-administered medications for this visit.    REVIEW OF SYSTEMS:   Constitutional: Denies fevers, chills Eyes: Denies blurriness of vision Ears, nose, mouth, throat, and face: Denies mucositis or sore throat Respiratory: Denies cough, dyspnea or wheezes Cardiovascular: Denies palpitation, chest discomfort or lower extremity swelling Skin: Denies abnormal skin rashes Lymphatics: Denies new lymphadenopathy or easy bruising Neurological:Denies numbness, tingling or new weaknesses Behavioral/Psych: Mood is stable, no new changes  All other systems were reviewed with the patient and are negative.  PHYSICAL EXAMINATION: ECOG PERFORMANCE STATUS: 1 - Symptomatic but completely ambulatory  Filed Vitals:   04/11/13 1356  BP: 134/64  Pulse: 106  Temp: 98.3 F (36.8 C)  Resp: 20   Filed Weights   04/11/13 1356  Weight: 169 lb 14.4 oz (77.066 kg)    GENERAL:alert, no distress and comfortable SKIN: skin color, texture, turgor are normal, no rashes or significant lesions EYES: normal, Conjunctiva are pink and non-injected, sclera clear OROPHARYNX:no exudate, no erythema and lips, buccal mucosa, and tongue normal  NECK: supple, thyroid normal size, non-tender, without nodularity LYMPH:  no palpable lymphadenopathy in the cervical, axillary or inguinal LUNGS: clear to auscultation and percussion with normal breathing effort HEART: regular rate & rhythm and no murmurs and no  lower extremity edema ABDOMEN:abdomen soft, non-tender and normal bowel sounds. Well-healed surgical scar. There is tenderness on palpation  around her right rib cage area. Musculoskeletal:no cyanosis of digits and no clubbing  NEURO: alert & oriented x 3 with fluent speech, no focal motor/sensory deficits  LABORATORY DATA:  I have reviewed the data as listed    Component Value Date/Time   NA 144 04/11/2013 1345   NA 139 12/28/2011 2006   K 3.7 04/11/2013 1345   K 3.6 12/28/2011 2006   CL 108* 04/20/2012 1530   CL 104 12/28/2011 2006   CO2 25 04/11/2013 1345   CO2 25 06/13/2011 1512   GLUCOSE 99 04/11/2013 1345   GLUCOSE 102* 04/20/2012 1530   GLUCOSE 133* 12/28/2011 2006   BUN 10.5 04/11/2013 1345   BUN 21 12/28/2011 2006   CREATININE 0.9 04/11/2013 1345   CREATININE 0.80 12/28/2011 2006   CALCIUM 8.9 04/11/2013 1345   CALCIUM 9.1 06/13/2011 1512   PROT 6.7 04/11/2013 1345   PROT 7.2 06/13/2011 1512   ALBUMIN 3.7 04/11/2013 1345   ALBUMIN 4.0 06/13/2011 1512   AST 33 04/11/2013 1345   AST 19 06/13/2011 1512   ALT 44 04/11/2013 1345   ALT 23 06/13/2011 1512   ALKPHOS 97 04/11/2013 1345   ALKPHOS 83 06/13/2011 1512   BILITOT 0.38 04/11/2013 1345   BILITOT 0.2* 06/13/2011 1512    No results found for this basename: SPEP,  UPEP,   kappa and lambda light chains    Lab Results  Component Value Date   WBC 9.0 04/11/2013   NEUTROABS 6.3 04/11/2013   HGB 13.3 04/11/2013   HCT 40.0 04/11/2013   MCV 86.4 04/11/2013   PLT 333 04/11/2013      Chemistry      Component Value Date/Time   NA 144 04/11/2013 1345   NA 139 12/28/2011 2006   K 3.7 04/11/2013 1345   K 3.6 12/28/2011 2006   CL 108* 04/20/2012 1530   CL 104 12/28/2011 2006   CO2 25 04/11/2013 1345   CO2 25 06/13/2011 1512   BUN 10.5 04/11/2013 1345   BUN 21 12/28/2011 2006   CREATININE 0.9 04/11/2013 1345   CREATININE 0.80 12/28/2011 2006      Component Value Date/Time   CALCIUM 8.9 04/11/2013 1345   CALCIUM 9.1 06/13/2011 1512   ALKPHOS 97 04/11/2013 1345   ALKPHOS 83 06/13/2011 1512   AST 33 04/11/2013 1345   AST 19 06/13/2011 1512   ALT 44 04/11/2013 1345   ALT 23 06/13/2011 1512   BILITOT 0.38 04/11/2013 1345    BILITOT 0.2* 06/13/2011 1512     ASSESSMENT & PLAN:  #1 history of sarcoma #2 Right upper quadrant pain Due to the size of the mass and the symptoms of abdominal pain, I recommend imaging studies with CT scan of the abdomen and pelvis to rule out recurrence of disease. She is in agreement. If her imaging study was negative, I will see her back in 6 months with another history, physical examination, and blood work. For pain, I recommend she reduce taking nonsteroidal anti-inflammatory medications but to use Tylenol instead reduce her risk of gastritis. #3 hypothyroidism Recent thyroid function test was normal #4 severe diarrhea I recommend she increase oral intake and avoid dairy products   Orders Placed This Encounter  Procedures  . CT Abdomen Pelvis W Contrast    Standing Status: Future     Number of Occurrences:      Standing Expiration  Date: 04/11/2014    Order Specific Question:  Reason for exam:    Answer:  hx sarcoma, abdominal pain, diarrhea    Order Specific Question:  Is the patient pregnant?    Answer:  No    Order Specific Question:  Preferred imaging location?    Answer:  [facility]  . DG Chest 2 View    Standing Status: Future     Number of Occurrences:      Standing Expiration Date: 04/11/2014    Order Specific Question:  Reason for exam:    Answer:  hx sarcoma, lung nodule, cough    Order Specific Question:  Preferred imaging location?    Answer:  [facility]   All questions were answered. The patient knows to call the clinic with any problems, questions or concerns. No barriers to learning was detected. I spent 25 minutes counseling the patient face to face. The total time spent in the appointment was 30 minutes and more than 50% was on counseling and review of test results     [facility], [place], MD 04/11/2013 4:55 PM

## 2013-04-11 NOTE — Telephone Encounter (Signed)
per pof appt sch 4/14 @2 :45 per Dr Ileana Roup pt [facility] will call to sch CT-gave pt liquids

## 2013-04-12 ENCOUNTER — Telehealth: Payer: Self-pay | Admitting: *Deleted

## 2013-04-12 NOTE — Telephone Encounter (Signed)
Message copied by Cathlean Cower on Fri Apr 12, 2013  1:34 PM ------      Message from: [facility], [place]      Created: Thu Apr 11, 2013  3:30 PM      Regarding: CMP       Please let her know CMP is normal      ----- Message -----         From: [facility] Interface         Sent: 04/11/2013   2:01 PM           To: Heath Lark, MD                   ------

## 2013-04-12 NOTE — Telephone Encounter (Signed)
Informed pt of CMP wnl.  She verbalized understanding.

## 2013-04-17 ENCOUNTER — Ambulatory Visit (HOSPITAL_COMMUNITY)
Admission: RE | Admit: 2013-04-17 | Discharge: 2013-04-17 | Disposition: A | Discharge status: Home / Self Care | Payer: BC Managed Care – PPO | Source: Ambulatory Visit | Attending: Hematology and Oncology | Admitting: Hematology and Oncology

## 2013-04-17 ENCOUNTER — Encounter (HOSPITAL_COMMUNITY): Payer: Self-pay

## 2013-04-17 DIAGNOSIS — C44599 Other specified malignant neoplasm of skin of other part of trunk: Secondary | ICD-10-CM

## 2013-04-17 DIAGNOSIS — Z87891 Personal history of nicotine dependence: Secondary | ICD-10-CM | POA: Insufficient documentation

## 2013-04-17 DIAGNOSIS — K449 Diaphragmatic hernia without obstruction or gangrene: Secondary | ICD-10-CM | POA: Insufficient documentation

## 2013-04-17 DIAGNOSIS — R1011 Right upper quadrant pain: Secondary | ICD-10-CM | POA: Insufficient documentation

## 2013-04-17 DIAGNOSIS — Z8589 Personal history of malignant neoplasm of other organs and systems: Secondary | ICD-10-CM | POA: Insufficient documentation

## 2013-04-17 DIAGNOSIS — K7689 Other specified diseases of liver: Secondary | ICD-10-CM | POA: Insufficient documentation

## 2013-04-17 DIAGNOSIS — R0789 Other chest pain: Secondary | ICD-10-CM | POA: Insufficient documentation

## 2013-04-17 MED ORDER — IOHEXOL 300 MG/ML  SOLN
100.0000 mL | Freq: Once | INTRAMUSCULAR | Status: AC | PRN
  Administered 2013-04-17: 100 mL via INTRAVENOUS

## 2013-04-18 ENCOUNTER — Telehealth: Payer: Self-pay | Admitting: *Deleted

## 2013-04-18 DIAGNOSIS — R197 Diarrhea, unspecified: Secondary | ICD-10-CM

## 2013-04-18 NOTE — Telephone Encounter (Signed)
Message copied by Cathlean Cower on Thu Apr 18, 2013  9:26 AM ------      Message from: [facility], [place]: Wed Apr 17, 2013  9:31 PM      Regarding: results       I do not have an explanation for her abdominal pain      Can you call her and ask:      1) I recommend Gi eval especially she has diarrhea. If she agrees, I will send a referral and delay her next week's appt until after Gi eval      2) If her symptoms are better, we can cancel next week's appt and just reschedule to 6 months      ----- Message -----         From: Rad Results In Interface         Sent: 04/17/2013   2:08 PM           To: Heath Lark, MD                   ------

## 2013-04-18 NOTE — Telephone Encounter (Signed)
Informed pt of CT results wnl and no explanation for abd pain and diarrhea.  Pt states continues to have the diarrhea and abd pain.  She welcomes a referral to GI,  Has not seen one in the past and ok w/ Referral to [facility].  Informed her we will move her appt to see Dr. Alvy Bimler until after pt is seen by GI.  She agreed.

## 2013-04-19 ENCOUNTER — Telehealth: Payer: Self-pay | Admitting: Internal Medicine

## 2013-04-19 NOTE — Telephone Encounter (Signed)
Left message for patient to call back  

## 2013-04-19 NOTE — Telephone Encounter (Signed)
Patient is scheduled for appt for 04/23/13 2:00 with Carlean Purl

## 2013-04-22 ENCOUNTER — Telehealth: Payer: Self-pay | Admitting: Hematology and Oncology

## 2013-04-22 NOTE — Telephone Encounter (Signed)
, °

## 2013-04-23 ENCOUNTER — Encounter: Payer: Self-pay | Admitting: Internal Medicine

## 2013-04-23 ENCOUNTER — Ambulatory Visit: Payer: BC Managed Care – PPO | Admitting: Hematology and Oncology

## 2013-04-23 ENCOUNTER — Encounter: Payer: Self-pay | Admitting: Hematology and Oncology

## 2013-04-23 ENCOUNTER — Ambulatory Visit (INDEPENDENT_AMBULATORY_CARE_PROVIDER_SITE_OTHER): Payer: BC Managed Care – PPO | Admitting: Internal Medicine

## 2013-04-23 VITALS — BP 122/74 | HR 64 | Ht 66.0 in | Wt 169.0 lb

## 2013-04-23 DIAGNOSIS — R1031 Right lower quadrant pain: Secondary | ICD-10-CM

## 2013-04-23 DIAGNOSIS — R1011 Right upper quadrant pain: Secondary | ICD-10-CM

## 2013-04-23 DIAGNOSIS — R197 Diarrhea, unspecified: Secondary | ICD-10-CM

## 2013-04-23 MED ORDER — NA SULFATE-K SULFATE-MG SULF 17.5-3.13-1.6 GM/177ML PO SOLN: ORAL | Status: DC

## 2013-04-23 MED ORDER — DICYCLOMINE HCL 20 MG PO TABS: 20.0000 mg | ORAL_TABLET | Freq: Four times a day (QID) | ORAL | Status: DC | PRN

## 2013-04-23 NOTE — Patient Instructions (Addendum)
You have been scheduled for an endoscopy and colonoscopy with propofol. Please follow the written instructions given to you at your visit today. Please pick up your prep at the pharmacy within the next 1-3 days. If you use inhalers (even only as needed), please bring them with you on the day of your procedure. Your physician has requested that you go to www.startemmi.com and enter the access code given to you at your visit today. This web site gives a general overview about your procedure. However, you should still follow specific instructions given to you by our office regarding your preparation for the procedure.  Your physician has requested that you go to the basement for the following lab work before leaving today: C-Diff  We have sent the following medications to your pharmacy for you to pick up at your convenience: Generic Bentyl  You have been given a work note to excuse you for this week.  I appreciate the opportunity to care for you.

## 2013-04-23 NOTE — Assessment & Plan Note (Signed)
Colonoscopy to investigate. Dicyclomine 20 mg prn The risks and benefits as well as alternatives of endoscopic procedure(s) have been discussed and reviewed. All questions answered. The patient agrees to proceed.

## 2013-04-23 NOTE — Assessment & Plan Note (Addendum)
Cause not clear - relatively chronic Has had amoxicillin and doxycycline since dec so will check C diff PCR Colonoscopy and EGD The risks and benefits as well as alternatives of endoscopic procedure(s) have been discussed and reviewed. All questions answered. The patient agrees to proceed.

## 2013-04-23 NOTE — Progress Notes (Signed)
Put [employer] disability form on nurse's desk. °

## 2013-04-23 NOTE — Assessment & Plan Note (Signed)
EGD to evaluate ? Relationship to doxycycline Doubt from hiatal hernia but possible ? IBD Trial of dicyclomine The risks and benefits as well as alternatives of endoscopic procedure(s) have been discussed and reviewed. All questions answered. The patient agrees to proceed.

## 2013-04-23 NOTE — Progress Notes (Signed)
Patient ID: Connie Dillon, female   DOB: [birth year], 58 y.o.   MRN: [id number]       Consultation  Referring Provider:     Dr. Alvy Bimler Primary Care Physician:  Irven Shelling, MD Primary Gastroenterologist:        None Reason for Consultation:     Diarrhea/RUQ Pain     Impression / Plan:   Diarrhea Cause not clear - relatively chronic Has had amoxicillin and doxycycline since dec so will check C diff PCR Colonoscopy and EGD The risks and benefits as well as alternatives of endoscopic procedure(s) have been discussed and reviewed. All questions answered. The patient agrees to proceed.    RUQ pain EGD to evaluate ? Relationship to doxycycline Doubt from hiatal hernia but possible ? IBD Trial of dicyclomine The risks and benefits as well as alternatives of endoscopic procedure(s) have been discussed and reviewed. All questions answered. The patient agrees to proceed.   RLQ abdominal pain Colonoscopy to investigate. Dicyclomine 20 mg prn The risks and benefits as well as alternatives of endoscopic procedure(s) have been discussed and reviewed. All questions answered. The patient agrees to proceed.              HPI:   Connie Dillon is a 58 y.o. female s/p resection of dermatofibrosarcoma of right flank and abdomen in 2012, with a several month hx R abdominal pain and diarrhea/ These sxs have intensified in past few weeks. Hard to pinpoint onset but several months ago dull RUQ pain developed and she has persisted with this pain increasing and then urgent loose, yellow and post-prandial defecation. Does not have nocturnal diarrhea.  She has had radiation of the pain into RLQ. She had a CT scan abd/pelvis with contrast 2 weeks ago (images and report seen) and did not show an obvious cause of her problems. She does have a hiatal hernia. No signs of tumor recurrence. No weight loss. No bleeding. Had anal itching ealier in year, ? Pinworms from grandchildren and took an OTC  medicationwith relief.  She took amoxicillin and got hives in Dec and then in Feb had doxycycline x 1 week with an increase in RUQ pain. No dysphagia/odynophagia.  Pain and diarrhea such that she cannot work. Past Medical History  Diagnosis Date  . Hyperlipidemia   . Hypothyroid   . Anxiety   . History of SCC (squamous cell carcinoma) of skin   . Depression   . 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 Ocie Cornfield in 10/2010. [facility])  . Mediastinal mass 06/22/2011  . Diarrhea 04/11/2013    Past Surgical History  Procedure Laterality Date  . Cholecystectomy    . Lipoma removal  2011  . Laparoscopic endometriosis fulguration  1980's  . Wide excision of dermatofibrosarcoma of right flank  10/2010    by a Dr. Ocie Cornfield in [facility].     Family History  Problem Relation Age of Onset  . Heart attack Father   . Stroke Father   . Hyperlipidemia Father   . Hypertension Father   . Breast cancer Paternal Aunt      History  Substance Use Topics  . Smoking status: Former Smoker -- 1.00 packs/day for 10 years    Quit date: 05/15/2004  . Smokeless tobacco: Never Used  . Alcohol Use: No   Cl;erical worker Glass blower/designer, divorced, 2 sons  Prior to Admission medications   Medication Sig Start Date End Date Taking? Authorizing Provider  ibuprofen (ADVIL,MOTRIN) 200 MG tablet Take 400 mg by mouth every 6 (six) hours as needed. Pain.   Yes Historical Provider, MD  levothyroxine (SYNTHROID, LEVOTHROID) 50 MCG tablet Take 1 tablet (50 mcg total) by mouth daily. 03/06/12  Yes Jade L Breeback, PA-C  loratadine (CLARITIN) 10 MG tablet Take 10 mg by mouth daily as needed. Allergies. 11/21/11  Yes Jade L Breeback, PA-C  sertraline (ZOLOFT) 50 MG tablet Take 100 mg by mouth daily.    Yes Historical Provider, MD    Current Outpatient Prescriptions  Medication Sig Dispense Refill  . ibuprofen (ADVIL,MOTRIN) 200 MG tablet Take 400 mg by mouth  every 6 (six) hours as needed. Pain.      Marland Kitchen levothyroxine (SYNTHROID, LEVOTHROID) 50 MCG tablet Take 1 tablet (50 mcg total) by mouth daily.  30 tablet  0  . loratadine (CLARITIN) 10 MG tablet Take 10 mg by mouth daily as needed. Allergies.      Marland Kitchen sertraline (ZOLOFT) 50 MG tablet Take 100 mg by mouth daily.            Allergies as of 04/23/2013 - Review Complete 04/23/2013  Allergen Reaction Noted  . Amoxicillin  04/11/2013  . Doxycycline  04/17/2013  . Other  04/11/2013  . Peanuts [peanut oil] Hives 06/13/2011  . Statins  11/23/2011  . Sulfa antibiotics  04/11/2013  . Zicam cold remedy [erysidoron #1] Hives and Itching 12/28/2011  . Salmon [fish allergy] Rash 07/06/2011  . Sudafed [pseudoephedrine hcl] Palpitations 06/13/2011     Review of Systems:    This is positive for those things mentioned in the HPI, also positive for back pain, dyspnea, night sweats, fatigue. All other review of systems are negative.     Physical Exam:   Filed Vitals:   04/23/13 1332  BP: 122/74  Pulse: 64    General:  Well-developed, well-nourished and in no acute distress Eyes:  anicteric. ENT:   Mouth and posterior pharynx free of lesions.  Neck:   supple w/o thyromegaly or mass.  Lungs: Clear to auscultation bilaterally. Heart:  S1S2, no rubs, murmurs, gallops. Abdomen:  soft,  With mild tenderness in RUQ and mid area as well as epigastrium tender, no hepatosplenomegaly, hernia, or mass and BS+ Well-healed RLQ large scar.  Rectal: Female staff present - small anal tags, no rash, normal tone with soft tan stool, no mass Lymph:  no cervical or supraclavicular adenopathy. Extremities:   no edema Skin   no rash. + scar Right posterior scapular area Neuro:  A&O x 3.  Psych:  appropriate mood and  Affect.   Data Reviewed: Labs, CT scan, oncology notes in EMR CT IMPRESSION:  No acute abdominal/pelvic findings, mass lesions or adenopathy.  Diffuse fatty infiltration of the liver.  No  abdominal all mass.  Electronically Signed  By: Kalman Jewels M.D.  On: 04/17/2013 14:06    Thanks  I appreciate the opportunity to care for this patient. IO:[id number] Broadus John, MD and Heath Lark, MD

## 2013-04-24 ENCOUNTER — Other Ambulatory Visit: Payer: BC Managed Care – PPO

## 2013-04-24 DIAGNOSIS — R197 Diarrhea, unspecified: Secondary | ICD-10-CM

## 2013-04-24 DIAGNOSIS — R1031 Right lower quadrant pain: Secondary | ICD-10-CM

## 2013-04-24 DIAGNOSIS — R1011 Right upper quadrant pain: Secondary | ICD-10-CM

## 2013-04-25 ENCOUNTER — Telehealth: Payer: Self-pay | Admitting: *Deleted

## 2013-04-25 LAB — CLOSTRIDIUM DIFFICILE BY PCR: CDIFFPCR: NOT DETECTED

## 2013-04-25 NOTE — Telephone Encounter (Signed)
Informed pt we received disability form for Dr. Alvy Bimler to sign.  At this point Dr. Alvy Bimler is not treating her for anything and it may be beneficial to have form completed by PCP or GI MD since she is having most difficulty w/ abd pain and severe diarrhea.  She verbalized understanding.

## 2013-04-25 NOTE — Telephone Encounter (Signed)
Left VM for pt to return nurse's call regarding disability forms.

## 2013-04-26 ENCOUNTER — Ambulatory Visit (AMBULATORY_SURGERY_CENTER): Payer: BC Managed Care – PPO | Admitting: Internal Medicine

## 2013-04-26 ENCOUNTER — Encounter: Payer: Self-pay | Admitting: Internal Medicine

## 2013-04-26 ENCOUNTER — Telehealth: Payer: Self-pay | Admitting: Internal Medicine

## 2013-04-26 VITALS — BP 159/96 | HR 72 | Temp 98.6°F | Resp 16 | Ht 66.0 in | Wt 169.0 lb

## 2013-04-26 DIAGNOSIS — R1011 Right upper quadrant pain: Secondary | ICD-10-CM

## 2013-04-26 DIAGNOSIS — K5730 Diverticulosis of large intestine without perforation or abscess without bleeding: Secondary | ICD-10-CM

## 2013-04-26 DIAGNOSIS — R197 Diarrhea, unspecified: Secondary | ICD-10-CM

## 2013-04-26 DIAGNOSIS — D126 Benign neoplasm of colon, unspecified: Secondary | ICD-10-CM

## 2013-04-26 DIAGNOSIS — K648 Other hemorrhoids: Secondary | ICD-10-CM

## 2013-04-26 DIAGNOSIS — R1031 Right lower quadrant pain: Secondary | ICD-10-CM

## 2013-04-26 DIAGNOSIS — D131 Benign neoplasm of stomach: Secondary | ICD-10-CM

## 2013-04-26 DIAGNOSIS — K449 Diaphragmatic hernia without obstruction or gangrene: Secondary | ICD-10-CM

## 2013-04-26 DIAGNOSIS — K2940 Chronic atrophic gastritis without bleeding: Secondary | ICD-10-CM

## 2013-04-26 MED ORDER — SODIUM CHLORIDE 0.9 % IV SOLN: 500.0000 mL | INTRAVENOUS | Status: DC

## 2013-04-26 MED ORDER — PANTOPRAZOLE SODIUM 40 MG PO TBEC: 40.0000 mg | DELAYED_RELEASE_TABLET | Freq: Every day | ORAL | Status: DC

## 2013-04-26 NOTE — Telephone Encounter (Signed)
I changed the doctor's name on both [employer] sheets, and called the patient.  She is happy.

## 2013-04-26 NOTE — Progress Notes (Signed)
Report to pacu rn, vss, bbs=clear 

## 2013-04-26 NOTE — Patient Instructions (Addendum)
The upper endoscopy showed the hiatal hernia and some small stomach polyps - I took biopsies but the polyps are likely not any poblem.  There was a tiny polyp removed - it should be benign.  The colon lining was ok except for diverticulosis - I took biopsies to see if there is inflammation visible under the microscope - "microscopic colitis"  You also have a condition called diverticulosis - common and not usually a problem. Please read the handout provided.  There are internal hemorrhoids also.  Your jaw had to be pushed forward some to keep the oxygen flowing into the lungs so that will probably be sore some.  I will notify you about the pathology results and plans. Keep trying the dicyclomine to see if that helps.  I appreciate the opportunity to care for you. Gatha Mayer, MD, FACG  YOU HAD AN ENDOSCOPIC PROCEDURE TODAY AT [facility]: Refer to the procedure report that was given to you for any specific questions about what was found during the examination.  If the procedure report does not answer your questions, please call your gastroenterologist to clarify.  If you requested that your care partner not be given the details of your procedure findings, then the procedure report has been included in a sealed envelope for you to review at your convenience later.  YOU SHOULD EXPECT: Some feelings of bloating in the abdomen. Passage of more gas than usual.  Walking can help get rid of the air that was put into your GI tract during the procedure and reduce the bloating. If you had a lower endoscopy (such as a colonoscopy or flexible sigmoidoscopy) you may notice spotting of blood in your stool or on the toilet paper. If you underwent a bowel prep for your procedure, then you may not have a normal bowel movement for a few days.  DIET: Your first meal following the procedure should be a light meal and then it is ok to progress to your normal diet.  A half-sandwich or bowl of  soup is an example of a good first meal.  Heavy or fried foods are harder to digest and may make you feel nauseous or bloated.  Likewise meals heavy in dairy and vegetables can cause extra gas to form and this can also increase the bloating.  Drink plenty of fluids but you should avoid alcoholic beverages for 24 hours.  ACTIVITY: Your care partner should take you home directly after the procedure.  You should plan to take it easy, moving slowly for the rest of the day.  You can resume normal activity the day after the procedure however you should NOT DRIVE or use heavy machinery for 24 hours (because of the sedation medicines used during the test).    SYMPTOMS TO REPORT IMMEDIATELY: A gastroenterologist can be reached at any hour.  During normal business hours, 8:30 AM to 5:00 PM Monday through Friday, call [phone].  After hours and on weekends, please call the GI answering service at [phone] who will take a message and have the physician on call contact you.   Following lower endoscopy (colonoscopy or flexible sigmoidoscopy):  Excessive amounts of blood in the stool  Significant tenderness or worsening of abdominal pains  Swelling of the abdomen that is new, acute  Fever of 100F or higher  Following upper endoscopy (EGD)  Vomiting of blood or coffee ground material  New chest pain or pain under the shoulder blades  Painful or persistently difficult  swallowing  New shortness of breath  Fever of 100F or higher  Black, tarry-looking stools  FOLLOW UP: If any biopsies were taken you will be contacted by phone or by letter within the next 1-3 weeks.  Call your gastroenterologist if you have not heard about the biopsies in 3 weeks.  Our staff will call the home number listed on your records the next business day following your procedure to check on you and address any questions or concerns that you may have at that time regarding the information given to you following your  procedure. This is a courtesy call and so if there is no answer at the home number and we have not heard from you through the emergency physician on call, we will assume that you have returned to your regular daily activities without incident.  SIGNATURES/CONFIDENTIALITY: You and/or your care partner have signed paperwork which will be entered into your electronic medical record.  These signatures attest to the fact that that the information above on your After Visit Summary has been reviewed and is understood.  Full responsibility of the confidentiality of this discharge information lies with you and/or your care-partner.   Pick up pantoprazole at pharmacy.

## 2013-04-26 NOTE — Progress Notes (Signed)
0955 upon completing egd, patient apnic vs laryngospasm, jaw thrust mandibular pressure given , immediately began to breath, sao2 75% for 30seconds then back to 99%, sternal rub given by Carlean Purl, vss, BBS=clear.

## 2013-04-26 NOTE — Op Note (Addendum)
[place]  [address] [place], [address]   ENDOSCOPY PROCEDURE REPORT  PATIENT: Connie, Dillon  MR#: [id number] BIRTHDATE: [birth year] , 5  yrs. old GENDER: Female ENDOSCOPIST: Gatha Mayer, MD, Mar[facility] Regal REFERRED BY:   Heath Lark, MD PROCEDURE DATE:  04/26/2013 PROCEDURE:  EGD w/ biopsy ASA CLASS:     Class III INDICATIONS:  abdominal pain in the upper right quadrant. Unexplained diarrhea. MEDICATIONS: Propofol (Diprivan) 140 mg IV, MAC sedation, administered by CRNA, and These medications were titrated to patient response per physician's verbal order TOPICAL ANESTHETIC: Cetacaine Spray  DESCRIPTION OF PROCEDURE: After the risks benefits and alternatives of the procedure were thoroughly explained, informed consent was obtained.  The LB [id number] endoscope was introduced through the mouth and advanced to the second portion of the duodenum. Without limitations.  The instrument was slowly withdrawn as the mucosa was fully examined.    STOMACH: A 5 cm hiatal hernia was noted.   Multiple diminutive sessile polyps were found in the gastric body.  Multiple biopsies was performed using cold forceps.  Sample sent for histology.  The remainder of the upper endoscopy exam was otherwise normal. Retroflexed views revealed a hiatal hernia.     The scope was then withdrawn from the patient and the procedure completed.  COMPLICATIONS: There were no complications. ENDOSCOPIC IMPRESSION: 1.   5 cm hiatal hernia 2.   Multiple diminutive sessile polyps were found in the gastric body 3.   The remainder of the upper endoscopy exam was otherwise normal  RECOMMENDATIONS: 1.  Await pathology results 2.  Proceed with a Colonoscopy. 3. Pantoprazole 40 mg daily for heartburn/hiatal hernia   eSigned:  Gatha Mayer, MD, [facility] 04/26/2013 10:43 AM Revised: 04/26/2013 10:43 AM  CZ:[id number] Laurann Montana, MD, Heath Lark, MD and The Patient

## 2013-04-26 NOTE — Progress Notes (Signed)
Pt. Has ongoing right lower quadrant pain.  She complained with this pain during recovery period.  This is not new and not from colonoscopy.  Will take bentyl when she Arrives home.

## 2013-04-26 NOTE — Progress Notes (Signed)
On reviewing allergies, questioned if patient was allergic to eggs or soy. Patient stating eggs cause diarrhea and soy makes her "itchy". Discussed finding with Dr. Carlean Purl and Mont Dutton CRNA. Patient interviewed by Mr Jena Gauss with patient cleared for Propofol.

## 2013-04-26 NOTE — Progress Notes (Signed)
Called to room to assist during endoscopic procedure.  Patient ID and intended procedure confirmed with present staff. Received instructions for my participation in the procedure from the performing physician.  

## 2013-04-26 NOTE — Op Note (Signed)
[place]  [address]. [place], [address]   COLONOSCOPY PROCEDURE REPORT  PATIENT: Connie Dillon, Connie Dillon  MR#: [id number] BIRTHDATE: [birth year] , 74  yrs. old GENDER: Female ENDOSCOPIST: Gatha Mayer, MD, Marval Regal REFERRED BY:   Heath Lark, MD PROCEDURE DATE:  04/26/2013 PROCEDURE:   Colonoscopy with biopsy First Screening Colonoscopy - Avg.  risk and is 50 yrs.  old or older - No.  Prior Negative Screening - Now for repeat screening. N/A  History of Adenoma - Now for follow-up colonoscopy & has been > or = to 3 yrs.  N/A  Polyps Removed Today? Yes. ASA CLASS:   Class III INDICATIONS:unexplained diarrhea and abdominal pain in the lower right quadrant. MEDICATIONS: There was residual sedation effect present from prior procedure, Propofol (Diprivan) 110 mg IV, MAC sedation, administered by CRNA, and These medications were titrated to patient response per physician's verbal order  DESCRIPTION OF PROCEDURE:   After the risks benefits and alternatives of the procedure were thoroughly explained, informed consent was obtained.  A digital rectal exam revealed no abnormalities of the rectum.   The LB [id number]  endoscope was introduced through the anus and advanced to the cecum, which was identified by both the appendix and ileocecal valve. No adverse events experienced.   The quality of the prep was excellent using Suprep  The instrument was then slowly withdrawn as the colon was fully examined.  COLON FINDINGS: A diminutive sessile polyp was found in the transverse colon.  A polypectomy was performed with a cold snare. The resection was complete and the polyp tissue was completely retrieved.   Moderate diverticulosis was noted in the sigmoid colon.   The colon mucosa was otherwise normal - random biopsies taken. Unable to enter terminal ileum.  Retroflexed views revealed internal hemorrhoids. The time to cecum=2 minutes 47 seconds. Withdrawal time=10 minutes 19  seconds.  The scope was withdrawn and the procedure completed. COMPLICATIONS: There were no complications.  ENDOSCOPIC IMPRESSION: 1.   Diminutive sessile polyp was found in the transverse colon; polypectomy was performed with a cold snare 2.   Moderate diverticulosis was noted in the sigmoid colon 3.   The colon mucosa was otherwise normal - random biopsies taken  RECOMMENDATIONS: 1.  Await biopsy results 2.  Continue current medications - including dicyclomine   eSigned:  Gatha Mayer, MD, [facility] 04/26/2013 10:36 AM   cc: The Patient, Heath Lark, MD and Kelton Pillar, MD

## 2013-04-29 ENCOUNTER — Telehealth: Payer: Self-pay | Admitting: *Deleted

## 2013-04-29 ENCOUNTER — Telehealth: Payer: Self-pay | Admitting: Hematology and Oncology

## 2013-04-29 NOTE — Telephone Encounter (Signed)
No answer, left message to call if questions or concerns. 

## 2013-04-29 NOTE — Telephone Encounter (Signed)
s.w. pt and sched followup appt from 4.9.15 pof...pt saw Dr. Carlean Purl on 4.14.15

## 2013-04-30 ENCOUNTER — Encounter: Payer: Self-pay | Admitting: Internal Medicine

## 2013-04-30 DIAGNOSIS — Z860100 Personal history of colon polyps, unspecified: Secondary | ICD-10-CM

## 2013-04-30 HISTORY — DX: Personal history of colonic polyps: Z86.010

## 2013-04-30 HISTORY — DX: Personal history of colon polyps, unspecified: Z86.0100

## 2013-05-05 ENCOUNTER — Encounter: Payer: Self-pay | Admitting: Internal Medicine

## 2013-05-06 ENCOUNTER — Encounter: Payer: Self-pay | Admitting: Internal Medicine

## 2013-05-06 DIAGNOSIS — D131 Benign neoplasm of stomach: Secondary | ICD-10-CM

## 2013-05-06 HISTORY — DX: Benign neoplasm of stomach: D13.1

## 2013-05-13 ENCOUNTER — Ambulatory Visit (HOSPITAL_BASED_OUTPATIENT_CLINIC_OR_DEPARTMENT_OTHER): Payer: BC Managed Care – PPO | Admitting: Hematology and Oncology

## 2013-05-13 ENCOUNTER — Telehealth: Payer: Self-pay | Admitting: Hematology and Oncology

## 2013-05-13 ENCOUNTER — Telehealth: Payer: Self-pay | Admitting: *Deleted

## 2013-05-13 VITALS — BP 134/67 | HR 97 | Temp 98.2°F | Resp 20 | Ht 66.0 in | Wt 166.9 lb

## 2013-05-13 DIAGNOSIS — E039 Hypothyroidism, unspecified: Secondary | ICD-10-CM

## 2013-05-13 DIAGNOSIS — R1011 Right upper quadrant pain: Secondary | ICD-10-CM

## 2013-05-13 DIAGNOSIS — C44599 Other specified malignant neoplasm of skin of other part of trunk: Secondary | ICD-10-CM

## 2013-05-13 DIAGNOSIS — R197 Diarrhea, unspecified: Secondary | ICD-10-CM

## 2013-05-13 DIAGNOSIS — R109 Unspecified abdominal pain: Secondary | ICD-10-CM

## 2013-05-13 MED ORDER — PREGABALIN 75 MG PO CAPS: 75.0000 mg | ORAL_CAPSULE | Freq: Two times a day (BID) | ORAL | Status: DC

## 2013-05-13 NOTE — Progress Notes (Signed)
[place] OFFICE PROGRESS NOTE  Patient Care Team: Irven Shelling, MD as PCP - General (Internal Medicine) Heath Lark, MD as Consulting Physician (Hematology and Oncology)  DIAGNOSIS: Chronic right upper quadrant pain, history of fibrosarcoma status post excision  SUMMARY OF ONCOLOGIC HISTORY: She was in well until 07/2010 when she developed an enlarging right flank mass. 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 She had an MRI abdomen on 10/22/10 from [facility] which showed right lower quadrant mass in the dermis with minimal extension into the SQ fat; measured 2.8x0.9x1.8cm. There was no adenopathy. She was referred to Dr. Ocie Cornfield from [facility] and underwent in 10/2010 wide excision. Margin was widely negative. She has been on observation. Her CT chest/abd/pel 01/27/2011 was negative.  In April 2015, she has recurrence of severe pain. CT scan of the abdomen, pelvis, EGD and colonoscopy were all negative.  INTERVAL HISTORY: Connie Dillon 58 y.o. female returns for further followup. She still had persistent right upper quadrant pain free she rated at 7/10 pain. She had extensive evaluation by a gastroenterologist for evaluation of diarrhea.  I have reviewed the past medical history, past surgical history, social history and family history with the patient and they are unchanged from previous note.  ALLERGIES:  is allergic to amoxicillin; doxycycline; other; peanuts; statins; sulfa antibiotics; zicam cold remedy; salmon; and sudafed.  MEDICATIONS:  Current Outpatient Prescriptions  Medication Sig Dispense Refill  . dicyclomine (BENTYL) 20 MG tablet Take 1 tablet (20 mg total) by mouth every 6 (six) hours as needed for spasms.  90 tablet  0  . ibuprofen (ADVIL,MOTRIN) 200 MG tablet Take 400 mg by mouth every 6 (six)  hours as needed. Pain.      Marland Kitchen levothyroxine (SYNTHROID, LEVOTHROID) 50 MCG tablet Take 1 tablet (50 mcg total) by mouth daily.  30 tablet  0  . loratadine (CLARITIN) 10 MG tablet Take 10 mg by mouth daily as needed. Allergies.      . pantoprazole (PROTONIX) 40 MG tablet Take 1 tablet (40 mg total) by mouth daily before breakfast.  30 tablet  11  . sertraline (ZOLOFT) 50 MG tablet Take 100 mg by mouth daily.       . pregabalin (LYRICA) 75 MG capsule Take 1 capsule (75 mg total) by mouth 2 (two) times daily.  60 capsule  1   No current facility-administered medications for this visit.    REVIEW OF SYSTEMS:   All other systems were reviewed with the patient and are negative.  PHYSICAL EXAMINATION: ECOG PERFORMANCE STATUS: 1 - Symptomatic but completely ambulatory  Filed Vitals:   05/13/13 1304  BP: 134/67  Pulse: 97  Temp: 98.2 F (36.8 C)  Resp: 20   Filed Weights   05/13/13 1304  Weight: 166 lb 14.4 oz (75.705 kg)    GENERAL:alert, no distress and comfortable SKIN: skin color, texture, turgor are normal, no rashes or significant lesions EYES: normal, Conjunctiva are pink and non-injected, sclera clear Musculoskeletal:no cyanosis of digits and no clubbing  NEURO: alert & oriented x 3 with fluent speech, no focal motor/sensory deficits  LABORATORY DATA:  I have reviewed the data as listed    Component Value Date/Time   NA 144 04/11/2013 1345   NA 139 12/28/2011 2006   K 3.7 04/11/2013 1345   K 3.6 12/28/2011 2006   CL 108* 04/20/2012 1530   CL  104 12/28/2011 2006   CO2 25 04/11/2013 1345   CO2 25 06/13/2011 1512   GLUCOSE 99 04/11/2013 1345   GLUCOSE 102* 04/20/2012 1530   GLUCOSE 133* 12/28/2011 2006   BUN 10.5 04/11/2013 1345   BUN 21 12/28/2011 2006   CREATININE 0.9 04/11/2013 1345   CREATININE 0.80 12/28/2011 2006   CALCIUM 8.9 04/11/2013 1345   CALCIUM 9.1 06/13/2011 1512   PROT 6.7 04/11/2013 1345   PROT 7.2 06/13/2011 1512   ALBUMIN 3.7 04/11/2013 1345   ALBUMIN 4.0 06/13/2011 1512    AST 33 04/11/2013 1345   AST 19 06/13/2011 1512   ALT 44 04/11/2013 1345   ALT 23 06/13/2011 1512   ALKPHOS 97 04/11/2013 1345   ALKPHOS 83 06/13/2011 1512   BILITOT 0.38 04/11/2013 1345   BILITOT 0.2* 06/13/2011 1512    No results found for this basename: SPEP,  UPEP,   kappa and lambda light chains    Lab Results  Component Value Date   WBC 9.0 04/11/2013   NEUTROABS 6.3 04/11/2013   HGB 13.3 04/11/2013   HCT 40.0 04/11/2013   MCV 86.4 04/11/2013   PLT 333 04/11/2013      Chemistry      Component Value Date/Time   NA 144 04/11/2013 1345   NA 139 12/28/2011 2006   K 3.7 04/11/2013 1345   K 3.6 12/28/2011 2006   CL 108* 04/20/2012 1530   CL 104 12/28/2011 2006   CO2 25 04/11/2013 1345   CO2 25 06/13/2011 1512   BUN 10.5 04/11/2013 1345   BUN 21 12/28/2011 2006   CREATININE 0.9 04/11/2013 1345   CREATININE 0.80 12/28/2011 2006      Component Value Date/Time   CALCIUM 8.9 04/11/2013 1345   CALCIUM 9.1 06/13/2011 1512   ALKPHOS 97 04/11/2013 1345   ALKPHOS 83 06/13/2011 1512   AST 33 04/11/2013 1345   AST 19 06/13/2011 1512   ALT 44 04/11/2013 1345   ALT 23 06/13/2011 1512   BILITOT 0.38 04/11/2013 1345   BILITOT 0.2* 06/13/2011 1512     ASSESSMENT & PLAN:  #1 history of sarcoma #2 Right upper quadrant pain CT scan, EGD and colonoscopy were negative. 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 I recommend a trial of Lyrica #3 hypothyroidism Recent thyroid function test was normal #4 severe diarrhea I recommend she increase oral intake and avoid dairy products I would defer to GI for further management.  Orders Placed This Encounter  Procedures  . CBC with Differential    Standing Status: Future     Number of Occurrences:      Standing Expiration Date: 05/13/2014  . Comprehensive metabolic panel    Standing Status: Future     Number of Occurrences:      Standing Expiration Date: 05/13/2014   All questions were answered. The patient knows to call the clinic with any problems, questions or  concerns. No barriers to learning was detected. I spent 15 minutes counseling the patient face to face. The total time spent in the appointment was 20 minutes and more than 50% was on counseling and review of test results     Heath Lark, MD 05/13/2013 1:32 PM

## 2013-05-13 NOTE — Telephone Encounter (Signed)
S/w the pt and she is aware of her aug 2015 appts. °

## 2013-05-13 NOTE — Telephone Encounter (Signed)
Pt left VM after her office visit today.  States she has questions about her medications.  Called pt back and asked her to return nurse's call.

## 2013-05-14 ENCOUNTER — Telehealth: Payer: Self-pay | Admitting: *Deleted

## 2013-05-14 MED ORDER — GABAPENTIN 300 MG PO CAPS: 300.0000 mg | ORAL_CAPSULE | Freq: Three times a day (TID) | ORAL | Status: DC

## 2013-05-14 NOTE — Telephone Encounter (Signed)
Pt reports Lyrica costs her $250 and she cannot afford it.  Asks if Dr. Alvy Bimler has any other recommendations?

## 2013-05-14 NOTE — Telephone Encounter (Signed)
We can try gabapentin 300 mg TID No guarantee it would be cheaper; [facility] pharmacy might be cheaper if she wants to try gabapentin to be sent there

## 2013-05-14 NOTE — Telephone Encounter (Signed)
Informed pt will try Rx for Gabapentin 300 mg TID.  Instructed to start by taking once daily and increase as tolerated.  Cautioned it may make her feel drowsy or unsteady on her feet.  Also informed unsure of cost but hopefully less expensive than the Lyrica.  She can also try to get filled at our pharmacy it may be less expensive.  Pt requests rx be sent to [facility] and she will let us know if it is too expensive.   She will also drop off a new Disability form for Dr. Alvy Bimler to sign.  Her expected return to work date is June 1st.

## 2013-05-20 ENCOUNTER — Telehealth: Payer: Self-pay | Admitting: Internal Medicine

## 2013-05-20 NOTE — Telephone Encounter (Signed)
Patient reports that she has worsening RUQ pain.  Pain is worse in the am. neurontin has helped.  Patient states that the pain is debilitating "I can't live with this".  She will come in on Wed and see Dr. Carlean Purl at [facility]

## 2013-05-21 ENCOUNTER — Telehealth: Payer: Self-pay | Admitting: *Deleted

## 2013-05-21 ENCOUNTER — Encounter: Payer: Self-pay | Admitting: Hematology and Oncology

## 2013-05-21 NOTE — Progress Notes (Signed)
Faxed disability paper to [employer] @ [phone] °

## 2013-05-21 NOTE — Telephone Encounter (Signed)
Pt reports taking Gabapentin TID as directed.  States it helps her abd pain "slightly" but does not last more than a few hours.  She continues to have severe abd pain and cramping.  She is scheduled to see Dr. Carlean Purl again tomorrow to f/u regarding her abd pain.  She requests Disability paperwork signed by Dr. Alvy Bimler.  Completed form signed by Dr. Alvy Bimler and forwarded to Carmelina Noun in managed care dept.Marland Kitchen

## 2013-05-22 ENCOUNTER — Ambulatory Visit (INDEPENDENT_AMBULATORY_CARE_PROVIDER_SITE_OTHER): Payer: BC Managed Care – PPO | Admitting: Internal Medicine

## 2013-05-22 ENCOUNTER — Telehealth: Payer: Self-pay | Admitting: Internal Medicine

## 2013-05-22 ENCOUNTER — Encounter: Payer: Self-pay | Admitting: Internal Medicine

## 2013-05-22 VITALS — BP 132/82 | HR 80 | Ht 66.0 in | Wt 166.6 lb

## 2013-05-22 DIAGNOSIS — C44599 Other specified malignant neoplasm of skin of other part of trunk: Secondary | ICD-10-CM

## 2013-05-22 DIAGNOSIS — R1011 Right upper quadrant pain: Secondary | ICD-10-CM

## 2013-05-22 DIAGNOSIS — R1031 Right lower quadrant pain: Secondary | ICD-10-CM

## 2013-05-22 NOTE — Patient Instructions (Signed)
You have been scheduled for an MRI at [facility] on 05/30/13. Your appointment time is 5:00pm. Please arrive 15 minutes prior to your appointment time for registration purposes. Nothing to eat or drink after1:00pm. However, if you have any metal in your body, have a pacemaker or defibrillator, please be sure to let your ordering physician know. This test typically takes 45 minutes to 1 hour to complete.  The address is [address].   I appreciate the opportunity to care for you.

## 2013-05-22 NOTE — Progress Notes (Signed)
Subjective:    Patient ID: Connie Dillon, female    DOB: 08-01-1955, 58 y.o.   MRN: [id number]  HPI Patient is a risk of persistent right-sided abdominal pain. She says she cannot work and she cannot tolerate it. Lyrica was prescribed but it is too expensive, Neurontin was prescribed but it makes her sleepy so she cannot work. There is intense pain that is present most of the time and worse with palpation. She says she can feel bulges in the right upper quadrant. EGD, colonoscopy and CT scanning have been unrevealing. These symptoms began before she had her dermatofibroma sarcoma diagnosed. She actually does not were a bra because of hyperesthesia on that side. She denies any back pain or spinal issues. She is very frustrated, and very uncomfortable. She denies difficulty with sleep. Diarrhea has resolved though she is still having multiple bowel movements a day and can see some food in her stools.  Allergies  Allergen Reactions  . Amoxicillin   . Doxycycline     GERD  . Other     All Mycins  . Peanuts [Peanut Oil] Hives    ALL NUTS.    . Statins     Gain weight/muscle pains  . Sulfa Antibiotics   . Zicam Cold Remedy [Erysidoron #1] Hives and Itching  . Salmon [Fish Allergy] Rash  . Sudafed [Pseudoephedrine Hcl] Palpitations   Outpatient Prescriptions Prior to Visit  Medication Sig Dispense Refill  . dicyclomine (BENTYL) 20 MG tablet Take 1 tablet (20 mg total) by mouth every 6 (six) hours as needed for spasms.  90 tablet  0  . gabapentin (NEURONTIN) 300 MG capsule Take 1 capsule (300 mg total) by mouth 3 (three) times daily.  90 capsule  0  . ibuprofen (ADVIL,MOTRIN) 200 MG tablet Take 400 mg by mouth every 6 (six) hours as needed. Pain.      Marland Kitchen levothyroxine (SYNTHROID, LEVOTHROID) 50 MCG tablet Take 1 tablet (50 mcg total) by mouth daily.  30 tablet  0  . loratadine (CLARITIN) 10 MG tablet Take 10 mg by mouth daily as needed. Allergies.      . pantoprazole (PROTONIX) 40 MG  tablet Take 1 tablet (40 mg total) by mouth daily before breakfast.  30 tablet  11  . sertraline (ZOLOFT) 50 MG tablet Take 100 mg by mouth daily.        No facility-administered medications prior to visit.   Past Medical History  Diagnosis Date  . Hyperlipidemia   . Hypothyroid   . Anxiety   . History of SCC (squamous cell carcinoma) of skin   . Depression   . 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 Ocie Cornfield in 10/2010. [facility])  . Mediastinal mass 06/22/2011  . Diarrhea 04/11/2013  . Personal history of colonic polyp-Sessile serrated polyp 04/30/2013  . Benign fundic gland polyps of stomach 05/06/2013   Past Surgical History  Procedure Laterality Date  . Cholecystectomy    . Lipoma removal  2011  . Laparoscopic endometriosis fulguration  1980's  . Wide excision of dermatofibrosarcoma of right flank  10/2010    by a Dr. Ocie Cornfield in [facility].    History   Social History  . Marital Status: Divorced    Spouse Name: N/A    Number of Children: 2  . Years of Education: N/A   Occupational History  .      looking for work now.  Social History Main Topics  . Smoking status: Former Smoker -- 1.00 packs/day for 10 years    Quit date: 05/15/2004  . Smokeless tobacco: Never Used  . Alcohol Use: No  . Drug Use: No  . Sexual Activity: Not Currently   Review of Systems As above, she is also complaining of some dyspnea on exertion at times and fatigue    Objective:   Physical Exam Well-developed middle-aged white woman in no acute distress hyperesthetic RUQ/RLQ and chest wall O/w soft and nontender w/o abnormaility    Assessment & Plan:   1. Dermatofibrosarcoma protubera of flank   2. RUQ pain   3. RLQ abdominal pain    Her symptom complex and findings are most consistent with hyperesthesia of the abdominal and chest wall. I have some concern though it is low for recurrence of dermatofibrosarcoma, think an MRI is necessary  to better know that answered. I and off for the Lidoderm patch, cost might be an issue but even so she was willing to do it but there was a potential cross-reactivity with substances that are in the patch and in Sudafed and another agent may cause a rash, so we decided not to pursue that right now. Another consideration would be something like a tricyclic agent. It could help IBS-type symptoms and pain.  Be on the MRI doubt further GI or abdominal imaging or workup would be necessary.  CC: Irven Shelling, MD and Heath Lark, MD

## 2013-05-22 NOTE — Telephone Encounter (Signed)
All questions about diagnosis reviewed.  She is questioning the "mediastinal mass" listed in her chart.  I have explained that this was entered by Dr. Lamonte Sakai in 2013 after a chest x-ray.  She did have a follow up CT to evaluate further

## 2013-05-23 ENCOUNTER — Telehealth: Payer: Self-pay | Admitting: *Deleted

## 2013-05-23 NOTE — Telephone Encounter (Signed)
Pt requests office visit note from Dr. Alvy Bimler be faxed to [facility] at fax [phone].  Office note faxed for pt's disability claim.

## 2013-05-24 ENCOUNTER — Encounter: Payer: Self-pay | Admitting: Internal Medicine

## 2013-05-24 ENCOUNTER — Telehealth: Payer: Self-pay | Admitting: Internal Medicine

## 2013-05-24 NOTE — Telephone Encounter (Signed)
Patient is advised that she is having worsening pain on her left side. The only thing that helps is the neurontin.  She wanted to know if anything else is planned.  She is advised that the next step in is the MRI planned for Monday of next week.

## 2013-05-27 ENCOUNTER — Other Ambulatory Visit: Payer: Self-pay | Admitting: Internal Medicine

## 2013-05-27 ENCOUNTER — Ambulatory Visit (HOSPITAL_COMMUNITY)
Admission: RE | Admit: 2013-05-27 | Discharge: 2013-05-27 | Disposition: A | Discharge status: Home / Self Care | Payer: BC Managed Care – PPO | Source: Ambulatory Visit | Attending: Internal Medicine | Admitting: Internal Medicine

## 2013-05-27 DIAGNOSIS — C44599 Other specified malignant neoplasm of skin of other part of trunk: Secondary | ICD-10-CM

## 2013-05-27 DIAGNOSIS — R1011 Right upper quadrant pain: Secondary | ICD-10-CM | POA: Insufficient documentation

## 2013-05-27 DIAGNOSIS — K7689 Other specified diseases of liver: Secondary | ICD-10-CM | POA: Insufficient documentation

## 2013-05-27 DIAGNOSIS — R1031 Right lower quadrant pain: Secondary | ICD-10-CM | POA: Insufficient documentation

## 2013-05-27 MED ORDER — GADOBENATE DIMEGLUMINE 529 MG/ML IV SOLN
15.0000 mL | Freq: Once | INTRAVENOUS | Status: AC | PRN
  Administered 2013-05-27: 15 mL via INTRAVENOUS

## 2013-05-28 ENCOUNTER — Encounter: Payer: Self-pay | Admitting: Internal Medicine

## 2013-05-28 NOTE — Progress Notes (Signed)
Quick Note:  The MRI does not show any concerning abnormalities or cause of her pain. Some fatty liver but not a clinical issue now. I do not have any specific treatment recommendations at this time as though she has abdominal pain it is not "GI# in origin. The clinical features she has indicate neuropathic pain (from the nerves that supply this area) - could be a post-op issue? She may need to go to a pain clinic - would have her ask her PCP who they prefer  Please cc her PCP ______

## 2013-05-30 ENCOUNTER — Ambulatory Visit (HOSPITAL_COMMUNITY): Admission: RE | Admit: 2013-05-30 | Payer: BC Managed Care – PPO | Source: Ambulatory Visit

## 2013-06-25 ENCOUNTER — Other Ambulatory Visit: Payer: Self-pay | Admitting: Obstetrics and Gynecology

## 2013-06-25 DIAGNOSIS — M7989 Other specified soft tissue disorders: Secondary | ICD-10-CM

## 2013-06-25 DIAGNOSIS — M79629 Pain in unspecified upper arm: Secondary | ICD-10-CM

## 2013-06-27 ENCOUNTER — Other Ambulatory Visit: Payer: Self-pay | Admitting: Internal Medicine

## 2013-06-27 MED ORDER — DICYCLOMINE HCL 20 MG PO TABS: 20.0000 mg | ORAL_TABLET | Freq: Four times a day (QID) | ORAL | Status: DC | PRN

## 2013-06-28 ENCOUNTER — Ambulatory Visit
Admission: RE | Admit: 2013-06-28 | Discharge: 2013-06-28 | Disposition: A | Discharge status: Home / Self Care | Payer: BC Managed Care – PPO | Source: Ambulatory Visit | Attending: Obstetrics and Gynecology | Admitting: Obstetrics and Gynecology

## 2013-06-28 DIAGNOSIS — M79629 Pain in unspecified upper arm: Secondary | ICD-10-CM

## 2013-06-28 DIAGNOSIS — M7989 Other specified soft tissue disorders: Secondary | ICD-10-CM

## 2013-07-02 ENCOUNTER — Encounter: Payer: Self-pay | Admitting: Internal Medicine

## 2013-07-02 ENCOUNTER — Other Ambulatory Visit: Payer: BC Managed Care – PPO

## 2013-07-02 DIAGNOSIS — K449 Diaphragmatic hernia without obstruction or gangrene: Secondary | ICD-10-CM

## 2013-07-02 DIAGNOSIS — R109 Unspecified abdominal pain: Secondary | ICD-10-CM

## 2013-07-04 ENCOUNTER — Other Ambulatory Visit: Payer: BC Managed Care – PPO

## 2013-07-04 NOTE — Telephone Encounter (Signed)
Patient notified that she will need UGI per Dr. Trixie Dredge recommendations.  She is scheduled for [facility] for 07/09/13 9:30.  She is advised to arrive at 9:15 and be NPO after midnight

## 2013-07-09 ENCOUNTER — Ambulatory Visit (HOSPITAL_COMMUNITY): Payer: BC Managed Care – PPO

## 2013-08-06 ENCOUNTER — Other Ambulatory Visit: Payer: Self-pay | Admitting: Internal Medicine

## 2013-08-06 DIAGNOSIS — M79605 Pain in left leg: Secondary | ICD-10-CM

## 2013-08-08 ENCOUNTER — Encounter: Payer: Self-pay | Admitting: Hematology and Oncology

## 2013-08-09 ENCOUNTER — Encounter: Payer: Self-pay | Admitting: *Deleted

## 2013-08-09 ENCOUNTER — Telehealth: Payer: Self-pay | Admitting: *Deleted

## 2013-08-09 NOTE — Telephone Encounter (Signed)
email reply sent to pt..regarding her need to r/s appts.

## 2013-08-13 ENCOUNTER — Ambulatory Visit
Admission: RE | Admit: 2013-08-13 | Discharge: 2013-08-13 | Disposition: A | Discharge status: Home / Self Care | Payer: BC Managed Care – PPO | Source: Ambulatory Visit | Attending: Internal Medicine | Admitting: Internal Medicine

## 2013-08-13 DIAGNOSIS — M79605 Pain in left leg: Secondary | ICD-10-CM

## 2013-08-15 ENCOUNTER — Other Ambulatory Visit: Payer: BC Managed Care – PPO

## 2013-08-15 ENCOUNTER — Ambulatory Visit: Payer: BC Managed Care – PPO | Admitting: Hematology and Oncology

## 2013-08-23 ENCOUNTER — Telehealth: Payer: Self-pay | Admitting: Hematology and Oncology

## 2013-08-23 NOTE — Telephone Encounter (Signed)
Spk w/pt on 08/03 per 07/31 POF advising of new schedule and sent out new schedule, this msg was not put into system at that time due to worked from home and needed to cl pt and forgot to add note...KJ

## 2013-09-04 ENCOUNTER — Other Ambulatory Visit: Payer: Self-pay | Admitting: *Deleted

## 2013-09-04 DIAGNOSIS — R202 Paresthesia of skin: Secondary | ICD-10-CM

## 2013-09-05 ENCOUNTER — Encounter: Payer: Self-pay | Admitting: Hematology and Oncology

## 2013-09-10 ENCOUNTER — Other Ambulatory Visit: Payer: Self-pay | Admitting: Nurse Practitioner

## 2013-09-10 ENCOUNTER — Ambulatory Visit
Admission: RE | Admit: 2013-09-10 | Discharge: 2013-09-10 | Disposition: A | Discharge status: Home / Self Care | Payer: BC Managed Care – PPO | Source: Ambulatory Visit | Attending: Nurse Practitioner | Admitting: Nurse Practitioner

## 2013-09-10 DIAGNOSIS — J40 Bronchitis, not specified as acute or chronic: Secondary | ICD-10-CM

## 2013-09-23 ENCOUNTER — Ambulatory Visit (INDEPENDENT_AMBULATORY_CARE_PROVIDER_SITE_OTHER): Payer: BC Managed Care – PPO | Admitting: Neurology

## 2013-09-23 DIAGNOSIS — R202 Paresthesia of skin: Secondary | ICD-10-CM

## 2013-09-23 DIAGNOSIS — R209 Unspecified disturbances of skin sensation: Secondary | ICD-10-CM

## 2013-09-23 NOTE — Procedures (Signed)
[facility] Neurology  [address]  [place], [place] [address] Tel: [phone] Fax:  [phone] Test Date:  09/23/2013  Patient: Connie Dillon DOB: [birth year] Physician: Narda Amber, DO  Sex: Female Height: 5\' 5"  Ref Phys: Irven Shelling  ID#: [id number] Temp: 35.0C Technician: Laureen Ochs R. NCS T.   Patient Complaints: Patient is a 58 year old female here for evaluation of numbness and tingling all 4 extremities. These symptoms started 3-4 months ago on the left leg and have progressed to the arm and to the right side.  NCV & EMG Findings: Extensive electrodiagnostic testing of the left upper extremity and lower extremity reveals:  1. Median, ulnar, and radial sensory responses are within normal limits.  2. Median and ulnar motor responses are within normal limits.  3. Sural, superficial peroneal, and medial plantar responses are within normal limits.  4. Tibial and peroneal motor responses are within normal limits.  5. Sparse chronic motor axon loss changes are seen affecting the tibialis anterior and gluteus medius muscles only. There is no accompanied active denervation.   Impression: 1. Chronic L5 radiculopathy affecting left lower extremity, very mild in degree electrically. 2. There is no evidence of a generalized sensorimotor polyneuropathy or cervical radiculopathy affecting the left side.   ___________________________ Narda Amber, DO    Nerve Conduction Studies Anti Sensory Summary Table   Site NR Peak (ms) Norm Peak (ms) P-T Amp (V) Norm P-T Amp  Left Median Anti Sensory (2nd Digit)  35C  Wrist    3.0 <3.8 31.0 >10  Left Radial Anti Sensory (Base 1st Digit)  35C  Wrist    2.1 <2.8 21.8 >10  Left Sup Peroneal Anti Sensory (Ant Lat Mall)  12 cm    2.8 <4.6 8.1 >3  Left Sural Anti Sensory (Lat Mall)  Calf    3.6 <4.6 5.4 >3  Left Ulnar Anti Sensory (5th Digit)  35C  Wrist    2.5 <3.2 21.5 >5   Ortho Sensory Summary Table   Site NR  Peak (ms) Norm Peak (ms) P-T Amp (V) Norm P-T Amp  Left Medial Plantar Ortho Sensory (Med Malleolus)  Digit 1    3.1  5.6    Motor Summary Table   Site NR Onset (ms) Norm Onset (ms) O-P Amp (mV) Norm O-P Amp Site1 Site2 Delta-0 (ms) Dist (cm) Vel (m/s) Norm Vel (m/s)  Left Median Motor (Abd Poll Brev)  35C    Ulnar wrist stim  Wrist    3.2 <4.0 5.3 >5 Elbow Wrist 5.3 27.0 51 >50  Elbow    8.5  5.2         Left Peroneal Motor (Ext Dig Brev)  Ankle    4.2 <6.0 3.9 >2.5 B Fib Ankle 6.8 32.0 47 >40  B Fib    11.0  3.5  Poplt B Fib 2.3 10.0 43 >40  Poplt    13.3  3.3         Left Peroneal TA Motor (Tib Ant)  Fib Head    2.2 <4.5 3.6 >3 Poplit Fib Head 1.6 10.0 63 >40  Poplit    3.8  3.0         Left Tibial Motor (Abd Hall Brev)  Ankle    4.0 <6.0 15.7 >4 Knee Ankle 8.9 39.5 44 >40  Knee    12.9  10.5         Left Ulnar Motor (Abd Dig Minimi)  35C  Wrist  2.2 <3.1 7.3 >7 B Elbow Wrist 3.5 21.0 60 >50  B Elbow    5.7  6.2  A Elbow B Elbow 1.4 10.0 71 >50  A Elbow    7.1  5.9          F Wave Studies   NR F-Lat (ms) Lat Norm (ms) L-R F-Lat (ms)  Left Tibial (Mrkrs) (Abd Hallucis)     49.56 <55    EMG   Side Muscle Ins Act Fibs Psw Fasc Number Recrt Dur Dur. Amp Amp. Poly Poly. Comment  Left 1stDorInt Nml Nml Nml Nml Nml Nml Nml Nml Nml Nml Nml Nml N/A  Left Ext Indicis Nml Nml Nml Nml Nml Nml Nml Nml Nml Nml Nml Nml N/A  Left PronatorTeres Nml Nml Nml Nml Nml Nml Nml Nml Nml Nml Nml Nml N/A  Left Biceps Nml Nml Nml Nml Nml Nml Nml Nml Nml Nml Nml Nml N/A  Left Triceps Nml Nml Nml Nml Nml Nml Nml Nml Nml Nml Nml Nml N/A  Left Deltoid Nml Nml Nml Nml Nml Nml Nml Nml Nml Nml Nml Nml N/A  Left AntTibialis Nml Nml Nml Nml 1- Mod-R Few 1+ Few 1+ Nml Nml N/A  Left Gastroc Nml Nml Nml Nml Nml Nml Nml Nml Nml Nml Nml Nml N/A  Left Flex Dig Long Nml Nml Nml Nml Nml Nml Nml Nml Nml Nml Nml Nml N/A  Left VastusLat Nml Nml Nml Nml Nml Nml Nml Nml Nml Nml Nml Nml N/A  Left GluteusMed Nml  Nml Nml Nml 1- Mod-R Few 1+ Few 1+ Nml Nml N/A      Waveforms:

## 2013-09-27 ENCOUNTER — Telehealth: Payer: Self-pay | Admitting: Neurology

## 2013-09-27 NOTE — Telephone Encounter (Signed)
Please call pt w/ NCV results / Venida Jarvis

## 2013-09-30 ENCOUNTER — Encounter: Payer: Self-pay | Admitting: *Deleted

## 2013-09-30 ENCOUNTER — Telehealth: Payer: Self-pay | Admitting: Neurology

## 2013-09-30 NOTE — Telephone Encounter (Signed)
If she simply wants to discuss results of EMG, I can see her in a follow-up slot.  If she would like to be evaluated, she needs new patient appointment.  Donika K. Posey Pronto, DO

## 2013-09-30 NOTE — Telephone Encounter (Signed)
Do you have results

## 2013-09-30 NOTE — Telephone Encounter (Signed)
See next note

## 2013-09-30 NOTE — Telephone Encounter (Signed)
Patient said that Dr. Laurann Montana told her to call you for results.  He also suggested for her to follow up with you.  She does not mind coming back in but would like to know a little about what is going on.

## 2013-09-30 NOTE — Telephone Encounter (Signed)
Patient should contact PCP's office for results, since we only perform the testing.  If Dr. Delene Ruffini office did not receive EMG, please resend it.  Diangelo Radel K. Posey Pronto, DO

## 2013-09-30 NOTE — Telephone Encounter (Signed)
Pt called back and spoke with andrea

## 2013-09-30 NOTE — Telephone Encounter (Signed)
Called and left message for the patient to call back for appt

## 2013-09-30 NOTE — Telephone Encounter (Signed)
Will you please call patient to schedule a new patient evaluation?  Thanks.

## 2013-09-30 NOTE — Telephone Encounter (Signed)
Pt called wanting to f/u on the results for her EMG. She had it done on 09/23/13. C/B [phone]

## 2013-10-04 ENCOUNTER — Ambulatory Visit (INDEPENDENT_AMBULATORY_CARE_PROVIDER_SITE_OTHER): Payer: BC Managed Care – PPO | Admitting: Neurology

## 2013-10-04 ENCOUNTER — Encounter: Payer: Self-pay | Admitting: Neurology

## 2013-10-04 VITALS — BP 104/70 | HR 90 | Ht 66.0 in | Wt 162.5 lb

## 2013-10-04 DIAGNOSIS — G609 Hereditary and idiopathic neuropathy, unspecified: Secondary | ICD-10-CM

## 2013-10-04 DIAGNOSIS — G5793 Unspecified mononeuropathy of bilateral lower limbs: Secondary | ICD-10-CM

## 2013-10-04 DIAGNOSIS — M79652 Pain in left thigh: Secondary | ICD-10-CM

## 2013-10-04 DIAGNOSIS — G629 Polyneuropathy, unspecified: Secondary | ICD-10-CM

## 2013-10-04 DIAGNOSIS — M79609 Pain in unspecified limb: Secondary | ICD-10-CM

## 2013-10-04 MED ORDER — GABAPENTIN 300 MG PO CAPS: 600.0000 mg | ORAL_CAPSULE | Freq: Three times a day (TID) | ORAL | Status: DC

## 2013-10-04 NOTE — Progress Notes (Signed)
[facility] Clinic Note - Initial Visit   Date: 10/04/2013  Connie Dillon MRN: [id number] DOB: 02/24/1955   Dear Dr. Laurann Montana:  Thank you for your kind referral of Connie Dillon for consultation of bilateral feet paresthsias. Although her history is well known to you, please allow [place] to reiterate it for the purpose of our medical record. The patient was accompanied to the clinic by self.     History of Present Illness: Connie Dillon is a 58 y.o. 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   Starting around the summer 2015, she developed left sciatica pain described as shooting down her leg.  Around the same time, she developed throbbing pain of her feet. Pain is described as burning, pinching, cramping pain of the soles of her feet.  It is worse with sitting > 10 minutes and improved if she leans forward.  She also complains of numbness of the hands and wrist and achy pain of her upper arm, wrist, and hands.  Ice also improves her pain.   She had MRI lumbar spine which showed disc degeneration at L3-4 with no significant foraminal or canal stenosis. EMG did not show any neuropathy.    Out-side paper records, electronic medical record, and images have been reviewed where available and summarized as:   Labs 09/02/2013:  Vitamin B12 405, HbA1c 5.8, TSH 2.66, CBC and CMP wnl  Lab Results  Component Value Date   TSH 1.332 10/10/2012   MRI lumbar spine 08/13/2013: 1. Transitional lumbosacral anatomy as above.  2. Moderate to advanced disc degeneration at L3-4 with mild bilateral neural foraminal narrowing and no spinal stenosis or evidence of neural impingement.   EMG 09/23/2013 of the left arm and legs: 1. Chronic L5 radiculopathy affecting left lower extremity, very mild in degree  electrically. 2. There is no evidence of a generalized sensorimotor polyneuropathy or cervical radiculopathy affecting the left side.   Past Medical History  Diagnosis Date  . Hyperlipidemia   . Hypothyroid   . Anxiety   . History of SCC (squamous cell carcinoma) of skin   . Depression   . 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 Ocie Cornfield in 10/2010. [facility])  . Mediastinal mass 06/22/2011  . Diarrhea 04/11/2013  . Personal history of colonic polyp-Sessile serrated polyp 04/30/2013  . Benign fundic gland polyps of stomach 05/06/2013    Past Surgical History  Procedure Laterality Date  . Cholecystectomy    . Lipoma removal  2011  . Laparoscopic endometriosis fulguration  1980's  . Wide excision of dermatofibrosarcoma of right flank  10/2010    by a Dr. Ocie Cornfield in [facility]      Medications:  Current Outpatient Prescriptions on File Prior to Visit  Medication Sig Dispense Refill  . dicyclomine (BENTYL) 20 MG tablet Take 1 tablet (20 mg total) by mouth every 6 (six) hours as needed for spasms.  120 tablet  11  . gabapentin (NEURONTIN) 300 MG capsule Take 1 capsule (300 mg total) by mouth 3 (three) times daily.  90 capsule  0  . ibuprofen (ADVIL,MOTRIN) 200 MG tablet Take 400 mg by mouth every 6 (six) hours as needed. Pain.      Marland Kitchen levothyroxine (SYNTHROID, LEVOTHROID) 50 MCG tablet Take 1 tablet (50 mcg total) by mouth daily.  30 tablet  0  . loratadine (  CLARITIN) 10 MG tablet Take 10 mg by mouth daily as needed. Allergies.      . pantoprazole (PROTONIX) 40 MG tablet Take 1 tablet (40 mg total) by mouth daily before breakfast.  30 tablet  11  . sertraline (ZOLOFT) 50 MG tablet Take 100 mg by mouth daily.        No current facility-administered medications on file prior to visit.    Allergies:  Allergies  Allergen Reactions  . Amoxicillin   . Doxycycline     GERD  . Other     All Mycins  . Peanuts [Peanut Oil] Hives    ALL  NUTS.    . Statins     Gain weight/muscle pains  . Sulfa Antibiotics   . Zicam Cold Remedy [Erysidoron #1] Hives and Itching  . Salmon [Fish Allergy] Rash  . Sudafed [Pseudoephedrine Hcl] Palpitations    Family History: Family History  Problem Relation Age of Onset  . Heart attack Father   . Stroke Father   . Hyperlipidemia Father   . Hypertension Father   . Breast cancer Paternal Aunt   . Colon cancer Paternal Aunt     Social History: History   Social History  . Marital Status: Divorced    Spouse Name: N/A    Number of Children: 2  . Years of Education: N/A   Occupational History  .      looking for work now.    Social History Main Topics  . Smoking status: Former Smoker -- 1.00 packs/day for 10 years    Quit date: 05/15/2004  . Smokeless tobacco: Never Used  . Alcohol Use: No  . Drug Use: No  . Sexual Activity: Not Currently   Other Topics Concern  . Not on file   Social History Narrative  . No narrative on file    Review of Systems:  CONSTITUTIONAL: No fevers, chills, night sweats, or weight loss.   EYES: No visual changes or eye pain ENT: No hearing changes.  No history of nose bleeds.   RESPIRATORY: No cough, wheezing and shortness of breath.   CARDIOVASCULAR: Negative for chest pain, and palpitations.   GI: Negative for abdominal discomfort, blood in stools or black stools.  No recent change in bowel habits.   GU:  No history of incontinence.   MUSCLOSKELETAL: +history of joint pain or swelling.  No myalgias.   SKIN: Negative for lesions, rash, and itching.   HEMATOLOGY/ONCOLOGY: Negative for prolonged bleeding, bruising easily, and swollen nodes.  No history of cancer.   ENDOCRINE: Negative for cold or heat intolerance, polydipsia or goiter.   PSYCH:  +depression or anxiety symptoms.   NEURO: As Above.   Vital Signs:  BP 104/70  Pulse 90  Ht 5\' 6"  (1.676 m)  Wt 162 lb 8 oz (73.71 kg)  BMI 26.24 kg/m2  SpO2 97%   General Medical Exam:     General:  Well appearing, comfortable.   Eyes/ENT: see cranial nerve examination.   Neck: No masses appreciated.  Full range of motion without tenderness.  No carotid bruits. Respiratory:  Clear to auscultation, good air entry bilaterally.   Cardiac:  Regular rate and rhythm, no murmur.   GI:  Soft, non-tender, non-distended abdomen.  Bowel sounds normal. No masses, organomegaly.   Back:  No pain to palpation of spinous processes.   Extremities:  No deformities, edema, or skin discoloration. Good capillary refill.   Skin:  Skin color, texture, turgor normal. No rashes or lesions.  Neurological Exam: MENTAL STATUS including orientation to time, place, person, recent and remote memory, attention span and concentration, language, and fund of knowledge is normal.  Speech is not dysarthric.  CRANIAL NERVES: II:  No visual field defects.  Unremarkable fundi.   III-IV-VI: Pupils equal round and reactive to light.  Normal conjugate, extra-ocular eye movements in all directions of gaze.  No nystagmus.  No ptosis.   V:  Normal facial sensation.   VII:  Normal facial symmetry and movements.   VIII:  Normal hearing and vestibular function.   IX-X:  Normal palatal movement.   XI:  Normal shoulder shrug and head rotation.   XII:  Normal tongue strength and range of motion, no deviation or fasciculation.  MOTOR:  No atrophy, fasciculations or abnormal movements.  No pronator drift.  Tone is normal.    Right Upper Extremity:    Left Upper Extremity:    Deltoid  5/5   Deltoid  5/5   Biceps  5/5   Biceps  5/5   Triceps  5/5   Triceps  5/5   Wrist extensors  5/5   Wrist extensors  5/5   Wrist flexors  5/5   Wrist flexors  5/5   Finger extensors  5/5   Finger extensors  5/5   Finger flexors  5/5   Finger flexors  5/5   Dorsal interossei  5/5   Dorsal interossei  5/5   Abductor pollicis  5/5   Abductor pollicis  5/5   Tone (Ashworth scale)  0  Tone (Ashworth scale)  0   Right Lower Extremity:     Left Lower Extremity:    Hip flexors  5/5   Hip flexors  5/5   Hip extensors  5/5   Hip extensors  5/5   Knee flexors  5/5   Knee flexors  5/5   Knee extensors  5/5   Knee extensors  5/5   Dorsiflexors  5/5   Dorsiflexors  5/5   Plantarflexors  5/5   Plantarflexors  5/5   Toe extensors  5/5   Toe extensors  5/5   Toe flexors  5/5   Toe flexors  5/5   Tone (Ashworth scale)  0  Tone (Ashworth scale)  0   MSRs:  Right                                                                 Left brachioradialis 2+  brachioradialis 2+  biceps 2+  biceps 2+  triceps 2+  triceps 2+  patellar 2+  patellar 2+  ankle jerk 2+  ankle jerk 2+  Hoffman no  Hoffman no  plantar response down  plantar response down   SENSORY:  Temperature is reduced in the feet bilaterally, otherwise, normal and symmetric perception of light touch, pinprick, vibration, and proprioception.  Romberg's sign absent.   COORDINATION/GAIT: Normal finger-to- nose-finger and heel-to-shin.  Intact rapid alternating movements bilaterally.  Able to rise from a chair without using arms.  Gait narrow based and stable. Tandem and stressed gait intact.    IMPRESSION: Ms. Tregre is a 58 year-old female presenting for evaluation of bilateral feet paresthesias.  Her neurological exam shows reduced temperature at the feet bilaterally and otherwise exam is intact.  I have personally reviewed imaging of the lumbar spine, labs, and EMG results with the patient.  There is no evidence of large fiber neuropathy or evidence of significant nerve impingement.  Based on her history and exam, symptoms are most consistent with a distal and symmetric small fiber neuropathy, which is not detected on electrodiagnostic testing.  I explained that skin biopsy and/or QSART testing (performed at academic centers) can be performed for definitive diagnosis, but she is not interested at this time.  She has already been screened for thyroid disease and diabetes which is  normal.  Laboratory testing looking for other treatable causes of neuropathy will be checked.    I had extensive discussion with the patient regarding the pathogenesis, etiology, management, and natural course of neuropathy. Neuropathy tends to be slowly progressive, especially if a treatable etiology is not identified.  I discussed that in the vast majority of cases, despite checking for reversible causes, we are unable to find the underlying etiology and management is symptomatic.  Fortunately, she is getting intermittent benefit with gabapentin, so I will optimize this medication.  She also complains of left upper thigh achy pain, which does not seem to be neurological in origin and possibly could be musculoskeletal vs. referred pain from hip/knee (?).   PLAN/RECOMMENDATIONS:  1.  Check copper, ceruloplasmin, SPEP/UPEP with IFE, celiac panel, vitamin B1 2.  Start using wrist splint at bedtime for hand paresthesias 3.  Increase gabapentin as follows:    AM  Afternoon   PM  Week 1  300mg   300mg    600mg   Week 2  600mg   300mg    600mg   Week 3  600mg   600mg    600mg  4.  Return to clinic in 47-months   The duration of this appointment visit was 45 minutes of face-to-face time with the patient.  Greater than 50% of this time was spent in counseling, explanation of diagnosis, planning of further management, and coordination of care.   Thank you for allowing me to participate in patient's care.  If I can answer any additional questions, I would be pleased to do so.    Sincerely,    Donika K. Posey Pronto, DO

## 2013-10-04 NOTE — Patient Instructions (Signed)
1.  Check blood work today 2.  Start using wrist splint at bedtime 3.  Increase gabapentin as follows:    AM  Afternoon   PM  Week 1 300mg   300mg    600mg   Week 2 600mg   300mg    600mg   Week 3 600mg   600mg    600mg  4.  Return to clinic in 67-months

## 2013-10-04 NOTE — Progress Notes (Signed)
Note faxed.

## 2013-10-06 LAB — COPPER, SERUM: COPPER: 125 ug/dL (ref 70–175)

## 2013-10-07 ENCOUNTER — Other Ambulatory Visit: Payer: BC Managed Care – PPO

## 2013-10-07 ENCOUNTER — Encounter: Payer: Self-pay | Admitting: Hematology and Oncology

## 2013-10-07 ENCOUNTER — Ambulatory Visit (HOSPITAL_BASED_OUTPATIENT_CLINIC_OR_DEPARTMENT_OTHER): Payer: BC Managed Care – PPO | Admitting: Hematology and Oncology

## 2013-10-07 VITALS — BP 110/61 | HR 91 | Temp 98.0°F | Resp 19 | Ht 66.0 in | Wt 162.0 lb

## 2013-10-07 DIAGNOSIS — G609 Hereditary and idiopathic neuropathy, unspecified: Secondary | ICD-10-CM

## 2013-10-07 DIAGNOSIS — Z85831 Personal history of malignant neoplasm of soft tissue: Secondary | ICD-10-CM

## 2013-10-07 DIAGNOSIS — R1011 Right upper quadrant pain: Secondary | ICD-10-CM

## 2013-10-07 DIAGNOSIS — Z8589 Personal history of malignant neoplasm of other organs and systems: Secondary | ICD-10-CM

## 2013-10-07 DIAGNOSIS — Z23 Encounter for immunization: Secondary | ICD-10-CM

## 2013-10-07 LAB — GLIADIN ANTIBODIES, SERUM
GLIADIN IGA: 4.5 U/mL (ref <20)
Gliadin IgG: 5.7 U/mL (ref <20)

## 2013-10-07 LAB — TISSUE TRANSGLUTAMINASE, IGA: Tissue Transglutaminase Ab, IgA: 4.5 U/mL (ref <20)

## 2013-10-07 LAB — CERULOPLASMIN: CERULOPLASMIN: 34 mg/dL (ref 18–53)

## 2013-10-07 MED ORDER — INFLUENZA VAC SPLIT QUAD 0.5 ML IM SUSY
0.5000 mL | PREFILLED_SYRINGE | Freq: Once | INTRAMUSCULAR | Status: AC
  Administered 2013-10-07: 0.5 mL via INTRAMUSCULAR
  Filled 2013-10-07: qty 0.5

## 2013-10-07 NOTE — Assessment & Plan Note (Signed)
This was likely related to postsurgical pain. Recommend observation only.

## 2013-10-07 NOTE — Assessment & Plan Note (Signed)
Clinically, she has no signs of recurrence. I will see her on a yearly basis with history, physical examination and blood work.

## 2013-10-07 NOTE — Progress Notes (Signed)
[facility] OFFICE PROGRESS NOTE  Patient Care Team: Irven Shelling, MD as PCP - General (Internal Medicine) Heath Lark, MD as Consulting Physician (Hematology and Oncology)  SUMMARY OF ONCOLOGIC HISTORY: She was in well until 07/2010 when she developed an enlarging right flank mass. 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 She had an MRI abdomen on 10/22/10 from [facility] which showed right lower quadrant mass in the dermis with minimal extension into the SQ fat; measured 2.8x0.9x1.8cm. There was no adenopathy. She was referred to Dr. Ocie Cornfield from [facility] and underwent in 10/2010 wide excision. Margin was widely negative. She has been on observation. Her CT chest/abd/pel 01/27/2011 was negative.  In April 2015, she has recurrence of severe pain. CT scan of the abdomen, pelvis, EGD and colonoscopy were all negative.   INTERVAL HISTORY: Please see below for problem oriented charting. She continues to have intermittent right lower quadrant pain that comes and loss. Recently, she saw a neurologist for peripheral neuropathy and pain. She continues to have chronic diarrhea and was so she has irritable bowel syndrome.  REVIEW OF SYSTEMS:   Constitutional: Denies fevers, chills or abnormal weight loss Eyes: Denies blurriness of vision Ears, nose, mouth, throat, and face: Denies mucositis or sore throat Respiratory: Denies cough, dyspnea or wheezes Cardiovascular: Denies palpitation, chest discomfort or lower extremity swelling Skin: Denies abnormal skin rashes Lymphatics: Denies new lymphadenopathy or easy bruising Behavioral/Psych: Mood is stable, no new changes  All other systems were reviewed with the patient and are negative.  I have reviewed the past medical history, past surgical history, social history and family  history with the patient and they are unchanged from previous note.  ALLERGIES:  is allergic to amoxicillin; doxycycline; other; peanuts; statins; sulfa antibiotics; zicam cold remedy; salmon; and sudafed.  MEDICATIONS:  Current Outpatient Prescriptions  Medication Sig Dispense Refill  . dicyclomine (BENTYL) 20 MG tablet Take 1 tablet (20 mg total) by mouth every 6 (six) hours as needed for spasms.  120 tablet  11  . gabapentin (NEURONTIN) 300 MG capsule Take 2 capsules (600 mg total) by mouth 3 (three) times daily.  180 capsule  5  . ibuprofen (ADVIL,MOTRIN) 200 MG tablet Take 400 mg by mouth every 6 (six) hours as needed. Pain.      Marland Kitchen levothyroxine (SYNTHROID, LEVOTHROID) 50 MCG tablet Take 1 tablet (50 mcg total) by mouth daily.  30 tablet  0  . loratadine (CLARITIN) 10 MG tablet Take 10 mg by mouth daily as needed. Allergies.      . pantoprazole (PROTONIX) 40 MG tablet Take 1 tablet (40 mg total) by mouth daily before breakfast.  30 tablet  11  . PROVENTIL HFA 108 (90 BASE) MCG/ACT inhaler       . sertraline (ZOLOFT) 50 MG tablet Take 100 mg by mouth daily.        No current facility-administered medications for this visit.    PHYSICAL EXAMINATION: ECOG PERFORMANCE STATUS: 1 - Symptomatic but completely ambulatory  Filed Vitals:   10/07/13 1536  BP: 110/61  Pulse: 91  Temp: 98 F (36.7 C)  Resp: 19   Filed Weights   10/07/13 1536  Weight: 162 lb (73.483 kg)    GENERAL:alert, no distress and comfortable SKIN: skin color, texture, turgor are normal, no rashes or significant lesions EYES: normal, Conjunctiva are pink and non-injected, sclera clear OROPHARYNX:no exudate,  no erythema and lips, buccal mucosa, and tongue normal  NECK: supple, thyroid normal size, non-tender, without nodularity LYMPH:  no palpable lymphadenopathy in the cervical, axillary or inguinal LUNGS: clear to auscultation and percussion with normal breathing effort HEART: regular rate & rhythm and no  murmurs and no lower extremity edema ABDOMEN:abdomen soft, non-tender and normal bowel sounds. Well-healed surgical scar Musculoskeletal:no cyanosis of digits and no clubbing  NEURO: alert & oriented x 3 with fluent speech, no focal motor/sensory deficits  LABORATORY DATA:  I have reviewed the data as listed    Component Value Date/Time   NA 144 04/11/2013 1345   NA 139 12/28/2011 2006   K 3.7 04/11/2013 1345   K 3.6 12/28/2011 2006   CL 108* 04/20/2012 1530   CL 104 12/28/2011 2006   CO2 25 04/11/2013 1345   CO2 25 06/13/2011 1512   GLUCOSE 99 04/11/2013 1345   GLUCOSE 102* 04/20/2012 1530   GLUCOSE 133* 12/28/2011 2006   BUN 10.5 04/11/2013 1345   BUN 21 12/28/2011 2006   CREATININE 0.9 04/11/2013 1345   CREATININE 0.80 12/28/2011 2006   CALCIUM 8.9 04/11/2013 1345   CALCIUM 9.1 06/13/2011 1512   PROT 6.7 04/11/2013 1345   PROT 7.2 06/13/2011 1512   ALBUMIN 3.7 04/11/2013 1345   ALBUMIN 4.0 06/13/2011 1512   AST 33 04/11/2013 1345   AST 19 06/13/2011 1512   ALT 44 04/11/2013 1345   ALT 23 06/13/2011 1512   ALKPHOS 97 04/11/2013 1345   ALKPHOS 83 06/13/2011 1512   BILITOT 0.38 04/11/2013 1345   BILITOT 0.2* 06/13/2011 1512    No results found for this basename: SPEP,  UPEP,   kappa and lambda light chains    Lab Results  Component Value Date   WBC 9.0 04/11/2013   NEUTROABS 6.3 04/11/2013   HGB 13.3 04/11/2013   HCT 40.0 04/11/2013   MCV 86.4 04/11/2013   PLT 333 04/11/2013      Chemistry      Component Value Date/Time   NA 144 04/11/2013 1345   NA 139 12/28/2011 2006   K 3.7 04/11/2013 1345   K 3.6 12/28/2011 2006   CL 108* 04/20/2012 1530   CL 104 12/28/2011 2006   CO2 25 04/11/2013 1345   CO2 25 06/13/2011 1512   BUN 10.5 04/11/2013 1345   BUN 21 12/28/2011 2006   CREATININE 0.9 04/11/2013 1345   CREATININE 0.80 12/28/2011 2006      Component Value Date/Time   CALCIUM 8.9 04/11/2013 1345   CALCIUM 9.1 06/13/2011 1512   ALKPHOS 97 04/11/2013 1345   ALKPHOS 83 06/13/2011 1512   AST 33 04/11/2013 1345   AST 19 06/13/2011  1512   ALT 44 04/11/2013 1345   ALT 23 06/13/2011 1512   BILITOT 0.38 04/11/2013 1345   BILITOT 0.2* 06/13/2011 1512      ASSESSMENT & PLAN:  History of sarcoma of soft tissue Clinically, she has no signs of recurrence. I will see her on a yearly basis with history, physical examination and blood work.  Need for prophylactic vaccination and inoculation against influenza We discussed the importance of preventive care and reviewed the vaccination programs. She does not have any prior allergic reactions to influenza vaccination. She agrees to proceed with influenza vaccination today and we will administer it today at the clinic.   RUQ pain This was likely related to postsurgical pain. Recommend observation only.  Peripheral neuropathy, idiopathic The cause is unknown. She is following up  with a neurologist and currently being managed there.    Orders Placed This Encounter  Procedures  . CBC with Differential    Standing Status: Future     Number of Occurrences:      Standing Expiration Date: 11/11/2014  . Comprehensive metabolic panel    Standing Status: Future     Number of Occurrences:      Standing Expiration Date: 11/11/2014   All questions were answered. The patient knows to call the clinic with any problems, questions or concerns. No barriers to learning was detected. I spent 15 minutes counseling the patient face to face. The total time spent in the appointment was 20 minutes and more than 50% was on counseling and review of test results     [facility], [place], MD 10/07/2013 8:47 PM

## 2013-10-07 NOTE — Assessment & Plan Note (Signed)
We discussed the importance of preventive care and reviewed the vaccination programs. She does not have any prior allergic reactions to influenza vaccination. She agrees to proceed with influenza vaccination today and we will administer it today at the clinic.  

## 2013-10-07 NOTE — Assessment & Plan Note (Signed)
The cause is unknown. She is following up with a neurologist and currently being managed there.

## 2013-10-08 ENCOUNTER — Ambulatory Visit: Payer: BC Managed Care – PPO | Admitting: Hematology and Oncology

## 2013-10-08 ENCOUNTER — Other Ambulatory Visit: Payer: BC Managed Care – PPO

## 2013-10-08 LAB — UIFE/LIGHT CHAINS/TP QN, 24-HR UR
ALBUMIN, U: DETECTED
ALPHA 1 UR: DETECTED — AB
ALPHA 2 UR: DETECTED — AB
BETA UR: DETECTED — AB
Gamma Globulin, Urine: DETECTED — AB
Total Protein, Urine: 10 mg/dL (ref 5–24)

## 2013-10-08 LAB — SPEP & IFE WITH QIG
ALBUMIN ELP: 62.8 % (ref 55.8–66.1)
ALPHA-1-GLOBULIN: 4.5 % (ref 2.9–4.9)
ALPHA-2-GLOBULIN: 10.9 % (ref 7.1–11.8)
BETA 2: 5 % (ref 3.2–6.5)
BETA GLOBULIN: 7.8 % — AB (ref 4.7–7.2)
GAMMA GLOBULIN: 9 % — AB (ref 11.1–18.8)
IgA: 197 mg/dL (ref 69–380)
IgG (Immunoglobin G), Serum: 578 mg/dL — ABNORMAL LOW (ref 690–1700)
IgM, Serum: 131 mg/dL (ref 52–322)
Total Protein, Serum Electrophoresis: 6.4 g/dL (ref 6.0–8.3)

## 2013-10-08 LAB — RETICULIN ANTIBODIES, IGA W TITER: Reticulin Ab, IgA: NEGATIVE

## 2013-10-08 LAB — VITAMIN B1: Vitamin B1 (Thiamine): 11 nmol/L (ref 8–30)

## 2013-10-09 ENCOUNTER — Ambulatory Visit: Payer: BC Managed Care – PPO | Admitting: Neurology

## 2013-10-09 ENCOUNTER — Telehealth: Payer: Self-pay | Admitting: Hematology and Oncology

## 2013-10-09 NOTE — Telephone Encounter (Signed)
lvm for pt regarding to Sept 2016 appts....mailed pt appt sched/avs and letter

## 2013-10-18 ENCOUNTER — Encounter: Payer: Self-pay | Admitting: Neurology

## 2013-11-20 ENCOUNTER — Other Ambulatory Visit: Payer: Self-pay | Admitting: *Deleted

## 2013-11-20 ENCOUNTER — Telehealth: Payer: Self-pay | Admitting: Neurology

## 2013-11-20 MED ORDER — GABAPENTIN 400 MG PO CAPS: 800.0000 mg | ORAL_CAPSULE | Freq: Three times a day (TID) | ORAL | Status: DC

## 2013-11-20 NOTE — Telephone Encounter (Signed)
Patient ok to increase to 800 mg tid.  Rx sent in.

## 2013-11-20 NOTE — Telephone Encounter (Signed)
Ok to send Rx for gabapentin 800mg  three times daily, it will be a higher dose so she will not need to take 100mg  tabs.  She should watch for side effects since we are increased it (sedation and lightheadedness).  Candido Flott K. Posey Pronto, DO

## 2013-11-20 NOTE — Telephone Encounter (Signed)
Patient is taking 600 mg tid and is having 100 mg tablets with it sometimes.  She said that she would like some 100 mg called in or what do you suggest that she take?

## 2013-11-20 NOTE — Telephone Encounter (Signed)
Pt called regarding her script for Gabapentin. Pt would like to speak to Dr. Posey Pronto regarding adjusting the dosage. Please call pt # [phone]

## 2013-11-27 ENCOUNTER — Encounter: Payer: Self-pay | Admitting: Neurology

## 2013-12-09 ENCOUNTER — Telehealth: Payer: Self-pay | Admitting: Neurology

## 2013-12-09 NOTE — Telephone Encounter (Signed)
Pt called and is in a lot of pain on her. She wants to know what she can do or if she can be referred to a Youth worker doctor. Please call her # [phone]

## 2013-12-09 NOTE — Telephone Encounter (Signed)
Ok for pain management referral. 

## 2013-12-09 NOTE — Telephone Encounter (Signed)
I have not called patient today but would you like for me to refer her to pain management.  We increased her gabapentin not too long ago.

## 2013-12-09 NOTE — Telephone Encounter (Signed)
Pt states that she is returning [place] call please call [phone]

## 2013-12-10 NOTE — Telephone Encounter (Signed)
Please call pt with the name of the pain clinic she is being referred to. She would like to follow up with them regarding her appt / Sherri S>

## 2013-12-10 NOTE — Telephone Encounter (Signed)
Please call the patient back asap she has called several times she is very upset that no one has called her back today

## 2013-12-10 NOTE — Telephone Encounter (Signed)
Referral sent to pain management.  Patient notified.

## 2013-12-11 NOTE — Telephone Encounter (Signed)
Patient given the name and number to [facility].

## 2013-12-12 ENCOUNTER — Telehealth: Payer: Self-pay | Admitting: *Deleted

## 2013-12-12 NOTE — Telephone Encounter (Signed)
Patient is requesting a Dr.s note for work she states she is really having a hard time standing at work Call back number [phone]

## 2013-12-12 NOTE — Telephone Encounter (Signed)
She needs to be reevaluated as neuropathy should not prevent her from standing and she may have another problem.  Please also inform her that I do not complete disability paperwork.    Donika K. Posey Pronto, DO

## 2013-12-13 ENCOUNTER — Telehealth: Payer: Self-pay | Admitting: Neurology

## 2013-12-13 NOTE — Telephone Encounter (Signed)
Patient saw her PCP this morning and received a note for work. She is frustrated that she did not have an appt with pain management this week. Made her aware the referral has been made, but we can not control appt protocols in other offices and the how fast the appt is is ultimately dependent on the office. She also complained that we have not faxed the MR report. Made her aware we would re-fax the MR report and for her to call if she needs anything further from [place]. She expressed understanding. MR faxed to [facility] at [phone] with confirmation received.

## 2013-12-13 NOTE — Telephone Encounter (Signed)
Pt states that she is still in pain please call [phone]

## 2013-12-13 NOTE — Telephone Encounter (Signed)
Please call patient for me.  She has already been referred to the pain clinic.

## 2013-12-13 NOTE — Telephone Encounter (Signed)
Connie Dillon is calling patient back.  Note sent to [facility].

## 2013-12-30 ENCOUNTER — Telehealth: Payer: Self-pay | Admitting: Neurology

## 2013-12-30 NOTE — Telephone Encounter (Signed)
Pt cancel appt for 01-02-14 and will call back to resch

## 2014-01-01 ENCOUNTER — Telehealth: Payer: Self-pay | Admitting: *Deleted

## 2014-01-01 NOTE — Telephone Encounter (Signed)
Patient would like to know if her finding for pain management ere back and if she truly needed a follow up appointment or could you make a recommendation over the phone please advise

## 2014-01-02 ENCOUNTER — Ambulatory Visit: Payer: BC Managed Care – PPO | Admitting: Neurology

## 2014-01-02 ENCOUNTER — Telehealth: Payer: Self-pay | Admitting: Neurology

## 2014-01-02 NOTE — Telephone Encounter (Signed)
Pt called wanting to confirm if the records from Dr. Bobbye Riggs office from [facility] were received regarding her visit w/ him. Pt wants to know after Dr. Posey Pronto is able to review the records if the pt needs to be seen by Dr. Posey Pronto and what is next.  C/b [phone]

## 2014-01-02 NOTE — Telephone Encounter (Signed)
Spoke with patient advised her that a referral was made to pain management they will be getting in contact with her for appointment times and dates

## 2014-01-02 NOTE — Telephone Encounter (Signed)
Ok to send referral to pain management for neuropathic pain.

## 2014-01-02 NOTE — Telephone Encounter (Signed)
12/30/13-records request recv'd by mail from [place]. Request sent to HIM [email or web address] via inter office mail for processing / Sherri S.

## 2014-01-02 NOTE — Telephone Encounter (Signed)
Order faxed.

## 2014-01-02 NOTE — Telephone Encounter (Signed)
Sent disability determination paper work to medical records

## 2014-01-07 ENCOUNTER — Ambulatory Visit: Payer: BC Managed Care – PPO | Admitting: Neurology

## 2014-03-18 ENCOUNTER — Telehealth: Payer: Self-pay

## 2014-03-18 NOTE — Telephone Encounter (Signed)
I can see her on Thursday 3/10 either at 830 am or 10 am, no need labs

## 2014-03-18 NOTE — Telephone Encounter (Signed)
S/w pt that Dr Alvy Bimler can see her on 3/10. Pt chose 10 am slot. Urgent POF sent.

## 2014-03-18 NOTE — Telephone Encounter (Addendum)
Pt is feeling a nodule on her R flank in the area where she had her prior surgery for soft tissue sarcoma, it is painful off and on. It has bothered her for about 2 months. She wishes a visit with Dr Alvy Bimler. Forwarded to Dr Alvy Bimler and Jill Alexanders.

## 2014-03-19 ENCOUNTER — Telehealth: Payer: Self-pay | Admitting: Hematology and Oncology

## 2014-03-19 NOTE — Telephone Encounter (Signed)
returned pt call and r/s appt due to having another appt.Marland KitchenMarland KitchenMarland KitchenMarland Kitchenpt ok and aware of new d.t

## 2014-03-20 ENCOUNTER — Ambulatory Visit: Payer: Self-pay | Admitting: Hematology and Oncology

## 2014-04-14 ENCOUNTER — Telehealth: Payer: Self-pay | Admitting: Hematology and Oncology

## 2014-04-14 NOTE — Telephone Encounter (Signed)
pt called to cx appt...done....pt said she will call back to r/s

## 2014-04-17 ENCOUNTER — Ambulatory Visit: Payer: Self-pay | Admitting: Hematology and Oncology

## 2014-05-19 ENCOUNTER — Telehealth: Payer: Self-pay | Admitting: Internal Medicine

## 2014-05-19 ENCOUNTER — Other Ambulatory Visit: Payer: Self-pay | Admitting: Internal Medicine

## 2014-05-19 ENCOUNTER — Ambulatory Visit: Payer: Self-pay

## 2014-05-19 ENCOUNTER — Ambulatory Visit
Admission: RE | Admit: 2014-05-19 | Discharge: 2014-05-19 | Disposition: A | Discharge status: Home / Self Care | Payer: 59 | Source: Ambulatory Visit | Attending: Internal Medicine | Admitting: Internal Medicine

## 2014-05-19 ENCOUNTER — Telehealth: Payer: Self-pay | Admitting: Hematology and Oncology

## 2014-05-19 DIAGNOSIS — R0789 Other chest pain: Secondary | ICD-10-CM

## 2014-05-19 NOTE — Telephone Encounter (Signed)
Received records from [facility] for appointment on 06/13/14 with Dr Debara Pickett.  Records given to [facility] (medical records) for Dr Lysbeth Penner schedule on 06/13/14.  lp

## 2014-05-19 NOTE — Telephone Encounter (Signed)
PT CALLED TO SCHED APPT...DONE....PT OK AND AWARE OF NEW D.T

## 2014-05-22 ENCOUNTER — Encounter: Payer: Self-pay | Admitting: Cardiovascular Disease

## 2014-05-22 ENCOUNTER — Ambulatory Visit (INDEPENDENT_AMBULATORY_CARE_PROVIDER_SITE_OTHER): Payer: 59 | Admitting: Cardiovascular Disease

## 2014-05-22 VITALS — BP 114/74 | HR 92 | Ht 64.0 in | Wt 161.9 lb

## 2014-05-22 DIAGNOSIS — R0609 Other forms of dyspnea: Secondary | ICD-10-CM

## 2014-05-22 DIAGNOSIS — I6523 Occlusion and stenosis of bilateral carotid arteries: Secondary | ICD-10-CM | POA: Diagnosis not present

## 2014-05-22 DIAGNOSIS — Z87898 Personal history of other specified conditions: Secondary | ICD-10-CM | POA: Insufficient documentation

## 2014-05-22 DIAGNOSIS — E785 Hyperlipidemia, unspecified: Secondary | ICD-10-CM | POA: Diagnosis not present

## 2014-05-22 DIAGNOSIS — I6529 Occlusion and stenosis of unspecified carotid artery: Secondary | ICD-10-CM | POA: Insufficient documentation

## 2014-05-22 DIAGNOSIS — R072 Precordial pain: Secondary | ICD-10-CM

## 2014-05-22 DIAGNOSIS — R079 Chest pain, unspecified: Secondary | ICD-10-CM | POA: Insufficient documentation

## 2014-05-22 NOTE — Patient Instructions (Signed)
Your physician has requested that you have en treadmill myoview. For further information please visit [email or web address]. Please follow instruction sheet, as given.  Your physician has requested that you have an echocardiogram. Echocardiography is a painless test that uses sound waves to create images of your heart. It provides your doctor with information about the size and shape of your heart and how well your heart's chambers and valves are working. This procedure takes approximately one hour. There are no restrictions for this procedure.  Your physician has requested that you have a carotid duplex. This test is an ultrasound of the carotid arteries in your neck. It looks at blood flow through these arteries that supply the brain with blood. Allow one hour for this exam. There are no restrictions or special instructions.  Your physician recommends that you return for lab work, FASTING, at your earliest convenience  Dr Croitoru recommends that you schedule a follow-up appointment AFTER YOU HAVE COMPLETED THESE STUDIES.

## 2014-05-22 NOTE — Progress Notes (Signed)
Patient ID: Connie Dillon, female   DOB: [birth year], 59 y.o.   MRN: [id number]     Cardiology Office Note   Date:  05/22/2014   ID:  Annelise Mccoy, DOB [birth year], MRN [id number]  PCP:  Irven Shelling, MD  Cardiologist:   Sanda Klein, MD   Chief Complaint  Patient presents with  . Chest Pain    x several months, patient has EKG from Monday, "up high in chest," occurs when sitting or laying down, heart racing  . Shortness of Breath    with minimal exertion  . Leg Swelling  . Dizziness    occasional  . Fatigue      History of Present Illness: Laelle Bridgett is a 59 y.o. female who presents for chest pain and dyspnea that occur with moderate exertion, but also sometimes present at rest. This has been going on for several months. She has to stop vacuuming her home after just 10 minutes because of dyspnea and profuse diuresis as well as chest tightness. Occasionally, she'll feel her heart racing when she is just lying in bed and is also associated chest tightness and shortness of breath. An electrocardiogram performed May 9 shows nonspecific ST segment depression that is primarily noticeable in leads V3 through V6 and is markedly different compared to an echocardiogram from 2007 which was normal.   She also complains of being always tired and very sleepy. Recent lab tests showed a normal TSH at 2.4 and a borderline low free T4. She does not think she snores and does not have other typical features of obstructive sleep apnea. He has been diagnosed with "small fiber neuropathy" primarily in her feet. She had successful curative surgery for dermatofibrosarcoma protuberans several years ago and is now followed in oncology clinic.   She does not have diabetes or hypertension and she quit smoking in 2005 after roughly 12 pack year history. Her father died from a myocardial infarction at age 4 and was by far the youngest of his siblings passed away (his other siblings lived to be around 61 years  old). She believes her cholesterol is high but does not know the exact values and I cannot find lab tests to corroborate this.  In 2007 she had a syncopal event and workup at that time included a normal echocardiogram, normal electrocardiogram, carotid duplex sonography showing mild bilateral plaque without significant stenoses. All this work up was done at [facility]. [facility]. She now lives in [place] to be close to her son and grandchildren. 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     Past Medical History  Diagnosis Date  . Hyperlipidemia   . Hypothyroid   . Anxiety   . History of SCC (squamous cell carcinoma) of skin   . Depression   . 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 Ocie Cornfield in 10/2010. [facility])  . Mediastinal mass 06/22/2011  . Diarrhea 04/11/2013  . Personal history of colonic polyp-Sessile serrated polyp 04/30/2013  . Benign fundic gland polyps of stomach 05/06/2013    Past Surgical History  Procedure Laterality Date  . Cholecystectomy    . Lipoma removal  2011  . Laparoscopic endometriosis fulguration  1980's  . Wide excision of dermatofibrosarcoma of right flank  10/2010    by a Dr. Ocie Cornfield in [facility].      Current Outpatient Prescriptions  Medication Sig Dispense Refill  . dicyclomine (BENTYL) 20 MG tablet Take 1 tablet (20 mg total) by mouth every 6 (six) hours as needed for spasms. 120 tablet 11  . gabapentin (NEURONTIN) 400 MG capsule Take 2 capsules (800 mg total) by mouth 3 (three) times daily. 180 capsule 5  . ibuprofen (ADVIL,MOTRIN) 200 MG tablet Take 400 mg by mouth every 6 (six) hours as needed. Pain.    Marland Kitchen levothyroxine (SYNTHROID, LEVOTHROID) 50 MCG tablet Take 1 tablet (50 mcg total) by mouth daily. 30 tablet 0  . loratadine (CLARITIN) 10 MG tablet Take 10 mg by mouth daily as needed. Allergies.    .  pantoprazole (PROTONIX) 40 MG tablet Take 40 mg by mouth daily as needed (acid reflux).    Marland Kitchen PROVENTIL HFA 108 (90 BASE) MCG/ACT inhaler     . sertraline (ZOLOFT) 50 MG tablet Take 100 mg by mouth daily.     . traMADol-acetaminophen (ULTRACET) 37.5-325 MG per tablet Take 1 tablet by mouth as needed.     No current facility-administered medications for this visit.    Allergies:   Amoxicillin; Doxycycline; Other; Peanuts; Statins; Sulfa antibiotics; Zicam cold remedy; Salmon; and Sudafed    Social History:  The patient  reports that she quit smoking about 11 years ago. She has never used smokeless tobacco. She reports that she does not drink alcohol or use illicit drugs.   Family History:  The patient's family history includes Breast cancer in her paternal aunt; Colon cancer in her paternal aunt; Heart attack in her father; Hyperlipidemia in her father; Hypertension in her father; Stroke in her father.    ROS:  Please see the history of present illness.    Otherwise, review of systems positive for none.   All other systems are reviewed and negative.    PHYSICAL EXAM: VS:  BP 114/74 mmHg  Pulse 92  Ht 5\' 4"  (1.626 m)  Wt 161 lb 14.4 oz (73.437 kg)  BMI 27.78 kg/m2 , BMI Body mass index is 27.78 kg/(m^2).  General: Alert, oriented x3, no distress Head: no evidence of trauma, PERRL, EOMI, no exophtalmos or lid lag, no myxedema, no xanthelasma; normal ears, nose and oropharynx Neck: normal jugular venous pulsations and no hepatojugular reflux; brisk carotid pulses without delay and no carotid bruits Chest: clear to auscultation, no signs of consolidation by percussion or palpation, normal fremitus, symmetrical and full respiratory excursions Cardiovascular: normal position and quality of the apical impulse, regular rhythm, normal first and second heart sounds, no murmurs, rubs or gallops Abdomen: no tenderness or distention, no masses by palpation, no abnormal pulsatility or arterial  bruits, normal bowel sounds, no hepatosplenomegaly Extremities: no clubbing, cyanosis or edema; 2+ radial, ulnar and brachial pulses bilaterally; 2+ right femoral, posterior tibial and dorsalis pedis pulses; 2+ left femoral, posterior tibial and dorsalis pedis pulses; no subclavian or femoral bruits Neurological: grossly nonfocal Psych: euthymic mood, full affect   EKG:  EKG is not ordered today.   Recent Labs: No results found for requested labs within last 365 days.    Lipid Panel No results found for: CHOL, TRIG, HDL, CHOLHDL, VLDL, LDLCALC, LDLDIRECT    Wt Readings from Last 3 Encounters:  05/22/14 161 lb 14.4 oz (73.437 kg)  10/07/13 162 lb (73.483 kg)  10/04/13 162 lb 8 oz (73.71 kg)     ASSESSMENT AND PLAN:  Although Mrs. Houchins's complaints are not entirely typical for coronary insufficiency, the presence of exertional chest discomfort and exertional dyspnea with  a newly abnormal electrocardiogram with nonspecific diffuse ST segment depression [address]. raises concern for coronary disease. I recommended she have a treadmill nuclear perfusion study as well as an echocardiogram. Will recheck her lipid profile. Will also look for worsening of previously described mild carotid artery plaque.   Current medicines are reviewed at length with the patient today.  The patient does not have concerns regarding medicines.  The following changes have been made:  no change  Labs/ tests ordered today include:   Orders Placed This Encounter  Procedures  . Lipid panel  . Myocardial Perfusion Imaging  . Echocardiogram   Patient Instructions  Your physician has requested that you have en treadmill myoview. For further information please visit [email or web address]. Please follow instruction sheet, as given.  Your physician has requested that you have an echocardiogram. Echocardiography is a painless test that uses sound waves to create images of your heart. It provides your doctor with  information about the size and shape of your heart and how well your heart's chambers and valves are working. This procedure takes approximately one hour. There are no restrictions for this procedure.  Your physician has requested that you have a carotid duplex. This test is an ultrasound of the carotid arteries in your neck. It looks at blood flow through these arteries that supply the brain with blood. Allow one hour for this exam. There are no restrictions or special instructions.  Your physician recommends that you return for lab work, FASTING, at your earliest convenience  Dr Teriyah Purington recommends that you schedule a follow-up appointment AFTER YOU HAVE COMPLETED THESE STUDIES.     Mikael Spray, MD  05/22/2014 2:34 PM    Sanda Klein, MD, [facility] [phone] office [phone] pager

## 2014-05-23 ENCOUNTER — Encounter: Payer: Self-pay | Admitting: Cardiovascular Disease

## 2014-05-24 ENCOUNTER — Emergency Department (HOSPITAL_COMMUNITY): Payer: 59

## 2014-05-24 ENCOUNTER — Encounter (HOSPITAL_COMMUNITY): Payer: Self-pay | Admitting: Emergency Medicine

## 2014-05-24 ENCOUNTER — Observation Stay (HOSPITAL_COMMUNITY)
Admission: EM | Admit: 2014-05-24 | Discharge: 2014-05-26 | Disposition: A | Discharge status: Home / Self Care | Payer: 59 | Attending: Cardiology | Admitting: Cardiology

## 2014-05-24 DIAGNOSIS — R072 Precordial pain: Secondary | ICD-10-CM

## 2014-05-24 DIAGNOSIS — R0602 Shortness of breath: Secondary | ICD-10-CM | POA: Diagnosis not present

## 2014-05-24 DIAGNOSIS — E039 Hypothyroidism, unspecified: Secondary | ICD-10-CM | POA: Diagnosis present

## 2014-05-24 DIAGNOSIS — Z87898 Personal history of other specified conditions: Secondary | ICD-10-CM

## 2014-05-24 DIAGNOSIS — R079 Chest pain, unspecified: Principal | ICD-10-CM

## 2014-05-24 LAB — TROPONIN I

## 2014-05-24 LAB — BASIC METABOLIC PANEL
Anion gap: 12 (ref 5–15)
BUN: 15 mg/dL (ref 6–20)
CO2: 22 mmol/L (ref 22–32)
Calcium: 9.2 mg/dL (ref 8.9–10.3)
Chloride: 104 mmol/L (ref 101–111)
Creatinine, Ser: 0.73 mg/dL (ref 0.44–1.00)
GFR calc Af Amer: >60 mL/min (ref >60)
Glucose, Bld: 199 mg/dL — ABNORMAL HIGH (ref 65–99)
POTASSIUM: 3.5 mmol/L (ref 3.5–5.1)
Sodium: 138 mmol/L (ref 135–145)

## 2014-05-24 LAB — BRAIN NATRIURETIC PEPTIDE: B Natriuretic Peptide: 58.5 pg/mL (ref 0.0–100.0)

## 2014-05-24 LAB — CBC
HCT: 43 % (ref 36.0–46.0)
HCT: 41.6 % (ref 36.0–46.0)
HEMOGLOBIN: 13.2 g/dL (ref 12.0–15.0)
Hemoglobin: 13.9 g/dL (ref 12.0–15.0)
MCH: 28.1 pg (ref 26.0–34.0)
MCH: 27.3 pg (ref 26.0–34.0)
MCHC: 32.3 g/dL (ref 30.0–36.0)
MCHC: 31.7 g/dL (ref 30.0–36.0)
MCV: 87 fL (ref 78.0–100.0)
MCV: 86 fL (ref 78.0–100.0)
PLATELETS: 332 10*3/uL (ref 150–400)
Platelets: 345 10*3/uL (ref 150–400)
RBC: 4.94 MIL/uL (ref 3.87–5.11)
RBC: 4.84 MIL/uL (ref 3.87–5.11)
RDW: 12.3 % (ref 11.5–15.5)
RDW: 12.3 % (ref 11.5–15.5)
WBC: 11.5 10*3/uL — ABNORMAL HIGH (ref 4.0–10.5)
WBC: 12 10*3/uL — ABNORMAL HIGH (ref 4.0–10.5)

## 2014-05-24 LAB — CREATININE, SERUM: Creatinine, Ser: 0.73 mg/dL (ref 0.44–1.00)

## 2014-05-24 LAB — I-STAT TROPONIN, ED
TROPONIN I, POC: 0 ng/mL (ref 0.00–0.08)
TROPONIN I, POC: 0 ng/mL (ref 0.00–0.08)

## 2014-05-24 LAB — D-DIMER, QUANTITATIVE: D-Dimer, Quant: <0.27 ug/mL-FEU (ref 0.00–0.48)

## 2014-05-24 MED ORDER — GUAIFENESIN 100 MG/5ML PO SOLN: 5.0000 mL | ORAL | Status: DC | PRN

## 2014-05-24 MED ORDER — DICYCLOMINE HCL 20 MG PO TABS: 20.0000 mg | ORAL_TABLET | Freq: Four times a day (QID) | ORAL | Status: DC | PRN

## 2014-05-24 MED ORDER — ONDANSETRON HCL 4 MG/2ML IJ SOLN: 4.0000 mg | Freq: Four times a day (QID) | INTRAMUSCULAR | Status: DC | PRN

## 2014-05-24 MED ORDER — LEVOTHYROXINE SODIUM 50 MCG PO TABS
50.0000 ug | ORAL_TABLET | Freq: Every day | ORAL | Status: DC
  Administered 2014-05-25 – 2014-05-26 (×2): 50 ug via ORAL
  Filled 2014-05-24 (×2): qty 1

## 2014-05-24 MED ORDER — TRAMADOL-ACETAMINOPHEN 37.5-325 MG PO TABS
1.0000 | ORAL_TABLET | Freq: Every day | ORAL | Status: DC | PRN
  Filled 2014-05-24: qty 1

## 2014-05-24 MED ORDER — ACETAMINOPHEN 325 MG PO TABS
650.0000 mg | ORAL_TABLET | ORAL | Status: DC | PRN
  Administered 2014-05-25 (×2): 650 mg via ORAL
  Filled 2014-05-24 (×3): qty 2

## 2014-05-24 MED ORDER — ALBUTEROL SULFATE (2.5 MG/3ML) 0.083% IN NEBU
5.0000 mg | INHALATION_SOLUTION | Freq: Once | RESPIRATORY_TRACT | Status: AC
  Administered 2014-05-24: 5 mg via RESPIRATORY_TRACT
  Filled 2014-05-24: qty 6

## 2014-05-24 MED ORDER — ASPIRIN 81 MG PO CHEW
324.0000 mg | CHEWABLE_TABLET | Freq: Once | ORAL | Status: AC
  Administered 2014-05-24: 324 mg via ORAL
  Filled 2014-05-24: qty 4

## 2014-05-24 MED ORDER — PANTOPRAZOLE SODIUM 40 MG PO TBEC: 40.0000 mg | DELAYED_RELEASE_TABLET | Freq: Every day | ORAL | Status: DC | PRN

## 2014-05-24 MED ORDER — SERTRALINE HCL 100 MG PO TABS
100.0000 mg | ORAL_TABLET | Freq: Every day | ORAL | Status: DC
  Administered 2014-05-24 – 2014-05-26 (×3): 100 mg via ORAL
  Filled 2014-05-24 (×3): qty 1

## 2014-05-24 MED ORDER — GUAIFENESIN 100 MG/5ML PO SOLN
5.0000 mL | Freq: Four times a day (QID) | ORAL | Status: DC | PRN
  Administered 2014-05-24: 100 mg via ORAL
  Filled 2014-05-24 (×2): qty 5

## 2014-05-24 MED ORDER — ASPIRIN 325 MG PO TABS: 325.0000 mg | ORAL_TABLET | ORAL | Status: DC

## 2014-05-24 MED ORDER — GABAPENTIN 300 MG PO CAPS
600.0000 mg | ORAL_CAPSULE | Freq: Three times a day (TID) | ORAL | Status: DC
  Administered 2014-05-24 – 2014-05-26 (×6): 600 mg via ORAL
  Filled 2014-05-24 (×6): qty 2

## 2014-05-24 NOTE — ED Notes (Signed)
Pt from home c/o central chest pain that has been ongoing. She c/o shortness of breath and sweats. Shortness of breath upon exertion.  She reports her cardiologist has "tests lined up on the 26".

## 2014-05-24 NOTE — ED Provider Notes (Signed)
CSN: [id number]     Arrival date & time 05/24/14  0541 History   First MD Initiated Contact with Patient 05/24/14 [phone]     Chief Complaint  Patient presents with  . Chest Pain     (Consider location/radiation/quality/duration/timing/severity/associated sxs/prior Treatment) HPI Comments: Patient states for the last 2 months she's had gradually worsening shortness of breath with any activity and lying down as well as intermittent chest pressure which is central and does not radiate. Chest pressure and discomfort is most pronounced at night with lying down but it can happen at rest or with exertion. In the last 1 week she has developed a cough with minimal sputum but denies any hemoptysis, weight loss or night sweats. No prior history of cardiac disease patient is a former smoker quit 8 years ago. She does not use inhalers regularly and has not been diagnosed with lung disease. She does have a remote history of cancer 4 years ago that was resected and did not require chemotherapy and gets yearly surveillance which has been fine.  She denies any recent travel or exogenous estrogen use. She has chronic upper abdominal pain that has been ongoing since her surgery in 2012 but feels that is slightly worse now. Patient recently saw cardiologist after referral from her PCP in the last week. She is scheduled for an echo and nuclear stress test on May 26 however because her symptoms were so uncomfortable at 4 AM this morning she came in for further evaluation.  Patient is a 59 y.o. female presenting with chest pain. The history is provided by the patient.  Chest Pain Pain location:  Substernal area Pain quality: aching and shooting   Pain radiates to:  Does not radiate Pain radiates to the back: no   Pain severity:  Moderate Onset quality:  Gradual Duration:  2 months Timing:  Intermittent Progression:  Waxing and waning Chronicity:  New Context: breathing, movement and at rest   Context: no stress and  no trauma   Worsened by:  Movement, deep breathing and coughing (lying down) Ineffective treatments:  None tried Associated symptoms: cough, palpitations and shortness of breath   Associated symptoms: no abdominal pain, no back pain, no fatigue, no fever, no headache, no lower extremity edema, no nausea, not vomiting and no weakness   Risk factors: high cholesterol   Risk factors: no birth control, no diabetes mellitus, no hypertension, no immobilization, not obese, no smoking and no surgery   Risk factors comment:  Father with MI in his 29's.  possibly sister with heart disease but unclear   Past Medical History  Diagnosis Date  . Hyperlipidemia   . Hypothyroid   . Anxiety   . History of SCC (squamous cell carcinoma) of skin   . Depression   . 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 Ocie Cornfield in 10/2010. [facility])  . Mediastinal mass 06/22/2011  . Diarrhea 04/11/2013  . Personal history of colonic polyp-Sessile serrated polyp 04/30/2013  . Benign fundic gland polyps of stomach 05/06/2013   Past Surgical History  Procedure Laterality Date  . Cholecystectomy    . Lipoma removal  2011  . Laparoscopic endometriosis fulguration  1980's  . Wide excision of dermatofibrosarcoma of right flank  10/2010    by a Dr. Ocie Cornfield in [facility].    Family History  Problem Relation Age of Onset  . Heart attack Father   . Stroke Father   . Hyperlipidemia Father   .  Hypertension Father   . Breast cancer Paternal Aunt   . Colon cancer Paternal Aunt    History  Substance Use Topics  . Smoking status: Former Smoker -- 1.00 packs/day for 10 years    Quit date: 05/16/2003  . Smokeless tobacco: Never Used  . Alcohol Use: No   OB History    No data available     Review of Systems  Constitutional: Negative for fever and fatigue.  Respiratory: Positive for cough and shortness of breath.   Cardiovascular: Positive for chest pain and palpitations.   Gastrointestinal: Negative for nausea, vomiting and abdominal pain.  Musculoskeletal: Negative for back pain.  Neurological: Negative for weakness and headaches.  All other systems reviewed and are negative.     Allergies  Doxycycline; Other; Peanuts; Statins; Zicam cold remedy; Amoxicillin; Salmon; Sudafed; and Sulfa antibiotics  Home Medications   Prior to Admission medications   Medication Sig Start Date End Date Taking? Authorizing Provider  gabapentin (NEURONTIN) 300 MG capsule Take 600 mg by mouth 3 (three) times daily.   Yes Historical Provider, MD  levothyroxine (SYNTHROID, LEVOTHROID) 50 MCG tablet Take 1 tablet (50 mcg total) by mouth daily. 03/06/12  Yes Jade L Breeback, PA-C  dicyclomine (BENTYL) 20 MG tablet Take 1 tablet (20 mg total) by mouth every 6 (six) hours as needed for spasms. 06/27/13   Gatha Mayer, MD  gabapentin (NEURONTIN) 400 MG capsule Take 2 capsules (800 mg total) by mouth 3 (three) times daily. 11/20/13   Donika Keith Rake, DO  ibuprofen (ADVIL,MOTRIN) 200 MG tablet Take 400 mg by mouth every 6 (six) hours as needed. Pain.    Historical Provider, MD  loratadine (CLARITIN) 10 MG tablet Take 10 mg by mouth daily as needed. Allergies. 11/21/11   Jade L Breeback, PA-C  pantoprazole (PROTONIX) 40 MG tablet Take 40 mg by mouth daily as needed (acid reflux).    Historical Provider, MD  PROVENTIL HFA 108 (90 BASE) MCG/ACT inhaler  09/06/13   Historical Provider, MD  sertraline (ZOLOFT) 50 MG tablet Take 100 mg by mouth daily.     Historical Provider, MD  traMADol-acetaminophen (ULTRACET) 37.5-325 MG per tablet Take 1 tablet by mouth as needed. 03/24/14   Historical Provider, MD   BP 135/65 mmHg  Pulse 80  Temp(Src) 98.5 F (36.9 C) (Oral)  Resp 13  SpO2 98% Physical Exam  Constitutional: She is oriented to person, place, and time. She appears well-developed and well-nourished. No distress.  HENT:  Head: Normocephalic and atraumatic.  Mouth/Throat: Oropharynx  is clear and moist.  Eyes: Conjunctivae and EOM are normal. Pupils are equal, round, and reactive to light.  Neck: Normal range of motion. Neck supple.  Cardiovascular: Normal rate, regular rhythm and intact distal pulses.   No murmur heard. Pulmonary/Chest: Effort normal. No respiratory distress. She has wheezes. She has no rales. She exhibits tenderness.  Scant wheezing which clear with cough  Abdominal: Soft. She exhibits no distension. There is tenderness in the right upper quadrant, epigastric area and left upper quadrant. There is no rebound and no guarding.  Musculoskeletal: Normal range of motion. She exhibits no edema or tenderness.  No calf tenderness  Neurological: She is alert and oriented to person, place, and time.  Skin: Skin is warm and dry. No rash noted. No erythema.  Psychiatric: She has a normal mood and affect. Her behavior is normal.  Nursing note and vitals reviewed.   ED Course  Procedures (including critical care time) Labs Review  Labs Reviewed  CBC - Abnormal; Notable for the following:    WBC 11.5 (*)    All other components within normal limits  BASIC METABOLIC PANEL - Abnormal; Notable for the following:    Glucose, Bld 199 (*)    All other components within normal limits  BRAIN NATRIURETIC PEPTIDE  D-DIMER, QUANTITATIVE  I-STAT TROPOININ, ED  Randolm Idol, ED    Imaging Review Dg Chest 2 View  05/24/2014   CLINICAL DATA:  Center to left-sided chest pain on off for a few months. Shortness of breath. Cough.  EXAM: CHEST  2 VIEW  COMPARISON:  05/19/2014  FINDINGS: Normal heart size and pulmonary vascularity. Calcified granuloma demonstrated anteriorly on the lateral view, probably in the left lung. No focal airspace disease or consolidation in the lungs. No blunting of costophrenic angles. No pneumothorax. Small esophageal hiatal hernia behind the heart.  IMPRESSION: No active cardiopulmonary disease.   Electronically Signed   By: Lucienne Capers M.D.    On: 05/24/2014 06:43     EKG Interpretation   Date/Time:  Saturday May 24 2014 05:52:15 EDT Ventricular Rate:  101 PR Interval:  117 QRS Duration: 93 QT Interval:  311 QTC Calculation: 403 R Axis:   64 Text Interpretation:  Sinus tachycardia Nonspecific repol abnormality,  diffuse leads ST depression, consider subendocardial injury Inferior leads  Confirmed by Maryan Rued  MD, Alleyne Lac ([id number]) on 05/24/2014 7:27:09 AM      MDM   Final diagnoses:  Chest pain, unspecified chest pain type  SOB (shortness of breath)    Patient presents today with somewhat atypical chest pain. She has been having symptoms intermittently for the last 2 months and seems to be occurring now 7-8 times a day with significant shortness of breath. It is worse at night when she lays down she has also recently developed a cough. Patient does not have any heart history does have a father who had a heart attack in his 25s and is a former smoker now has quit for 8 years.  Patient does have a history of a mediastinal mass as well as dermatofibrosarcoma of the flank in 2012 that was resected without requiring chemotherapy.  Prior to this patient has not had issues with chest pain or shortness of breath.  No fevers, weight loss, medication changes. Patient thought that her symptoms may be related to gabapentin so she has been slowly weaning her dose which has not made any difference. She saw Dr. Loletha Grayer with [facility] this week and has follow-up testing on May 26 with an echo and stress test. However because symptoms were becoming worse last night she presented for further evaluation.  Patient's EKG today with ST depression in inferior leads. This is slightly more pronounced thinning EKG done earlier this week that patient has from her appointment. Initial troponin is negative, CBC with mild elevation of white blood cell count which is baseline for this patient. She had her TSH checked this week which was within normal  limits. On exam patient has mild wheezing which clears with cough and upper abdominal tenderness which she states has been present since her surgery in 2012. Feel the patient's symptoms could be cardiac related however also potential for PE given history of cancer, GERD are also possibilities. Chest x-ray was within normal limits. D-dimer is pending.  Patient will be given albuterol as she is complaining of significant shortness of breath at this time.    9:18 AM D-dimer normal.  After albuterol pt's SOB was  not improved and still having intermittent chest pain.  Will admit for further care.  325 ASA given.  Spoke with Dr. Percival Spanish and pt will be transferred to [facility].  Blanchie Dessert, MD 05/24/14 1001

## 2014-05-24 NOTE — H&P (Signed)
CARDIOLOGY ADMISSION NOTE  Patient ID: Connie Dillon MRN: [id number] DOB/AGE: 08-13-1955 59 y.o.  Admit date: 05/24/2014 Primary Physician   Irven Shelling, MD Primary Cardiologist   Dr. Sallyanne Kuster Chief Complaint    Chest pain  HPI:  The patient came to [facility] ED with chest pain.  She has no history of CAD.  However she did recently see Dr. Loletha Grayer with chest pain. She was thought to have somewhat atypical symptoms.  However, she had some new EKG changes compared with an old EKG and with risk factors she was going to have a perfusion study.   She came to the ED today because of continued chest pain.  She has had pain for weeks off and on.  It seems to come in waves.  It will happen at rest.  It happens if she does activity.  She reports that it is a sharp midsternal discomfort. She is concerned because it is happening and waking her from her sleep as well. She doesn't describe radiation to her jaw or to her arms. Other symptoms have included increasing fatigue. He's had some exercise tolerance and shortness of breath with activities. She's not describing however PND or orthopnea. Very concerned about excessive diaphoresis she's having with even mild activity. This is the same symptoms she described previously during her recent clinic visit but because it happened for the third time in a week he presented to the emergency room. Here her enzymes have thus far been negative. EKG demonstrates some nonspecific changes.  Past Medical History  Diagnosis Date  . Hyperlipidemia   . Hypothyroid   . Anxiety   . History of SCC (squamous cell carcinoma) of skin   . Depression   . 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 Ocie Cornfield in 10/2010. [facility])  . Mediastinal mass 06/22/2011  . Diarrhea 04/11/2013  . Personal history of colonic polyp-Sessile serrated polyp 04/30/2013  . Benign fundic gland polyps of stomach 05/06/2013    Past Surgical History    Procedure Laterality Date  . Cholecystectomy    . Lipoma removal  2011  . Laparoscopic endometriosis fulguration  1980's  . Wide excision of dermatofibrosarcoma of right flank  10/2010    by a Dr. Ocie Cornfield in [facility].     Allergies  Allergen Reactions  . Doxycycline     GERD  . Other     All Mycins  . Peanuts [Peanut Oil] Hives    ALL NUTS.    . Statins     Gain weight/muscle pains  . Zicam Cold Remedy [Erysidoron #1] Hives and Itching  . Amoxicillin Hives and Rash  . Salmon [Fish Allergy] Rash  . Sudafed [Pseudoephedrine Hcl] Palpitations  . Sulfa Antibiotics Hives and Rash   No current facility-administered medications on file prior to encounter.   Current Outpatient Prescriptions on File Prior to Encounter  Medication Sig Dispense Refill  . dicyclomine (BENTYL) 20 MG tablet Take 1 tablet (20 mg total) by mouth every 6 (six) hours as needed for spasms. 120 tablet 11  . gabapentin (NEURONTIN) 400 MG capsule Take 2 capsules (800 mg total) by mouth 3 (three) times daily. 180 capsule 5  . ibuprofen (ADVIL,MOTRIN) 200 MG tablet Take 400 mg by mouth every 6 (six) hours as needed. Pain.    Marland Kitchen levothyroxine (SYNTHROID, LEVOTHROID) 50 MCG tablet Take 1 tablet (50 mcg total) by mouth daily. 30 tablet 0  . loratadine (CLARITIN) 10 MG tablet Take 10  mg by mouth daily as needed. Allergies.    . pantoprazole (PROTONIX) 40 MG tablet Take 40 mg by mouth daily as needed (acid reflux).    . sertraline (ZOLOFT) 50 MG tablet Take 100 mg by mouth daily.     . traMADol-acetaminophen (ULTRACET) 37.5-325 MG per tablet Take 1 tablet by mouth daily as needed for moderate pain.      History   Social History  . Marital Status: Divorced    Spouse Name: N/A  . Number of Children: 2  . Years of Education: N/A   Occupational History  .      looking for work now.    Social History Main Topics  . Smoking status: Former Smoker -- 1.00 packs/day for 10 years    Quit date: 05/16/2003  . Smokeless  tobacco: Never Used  . Alcohol Use: No  . Drug Use: No  . Sexual Activity: Not Currently   Other Topics Concern  . Not on file   Social History Narrative   She lives alone and has two grown children.   She works part-time in [employer] (medical).   Highest level of education:  College degree    Family History  Problem Relation Age of Onset  . Heart attack Father   . Stroke Father   . Hyperlipidemia Father   . Hypertension Father   . Breast cancer Paternal Aunt   . Colon cancer Paternal Aunt      ROS:  As stated in the HPI and negative for all other systems.  Physical Exam: Blood pressure 122/58, pulse 74, temperature 97.8 F (36.6 C), temperature source Oral, resp. rate 16, height 5\' 4"  (1.626 m), weight 160 lb 14.4 oz (72.984 kg), SpO2 97 %.  GENERAL:  Well appearing HEENT:  Pupils equal round and reactive, fundi not visualized, oral mucosa unremarkable NECK:  No jugular venous distention, waveform within normal limits, carotid upstroke brisk and symmetric, no bruits, no thyromegaly LYMPHATICS:  No cervical, inguinal adenopathy LUNGS:  Clear to auscultation bilaterally BACK:  No CVA tenderness CHEST:  Unremarkable HEART:  PMI not displaced or sustained,S1 and S2 within normal limits, no S3, no S4, no clicks, no rubs,   murmurs ABD:  Flat, positive bowel sounds normal in frequency in pitch, no bruits, no rebound, no guarding, no midline pulsatile mass, no hepatomegaly, no splenomegaly EXT:  2 plus pulses throughout, no edema, no cyanosis no clubbing SKIN:  No rashes no nodules NEURO:  Cranial nerves II through XII grossly intact, motor grossly intact throughout PSYCH:  Cognitively intact, oriented to person place and time  Labs: Lab Results  Component Value Date   BUN 15 05/24/2014   Lab Results  Component Value Date   CREATININE 0.73 05/24/2014   Lab Results  Component Value Date   NA 138 05/24/2014   K 3.5 05/24/2014   CL 104 05/24/2014   CO2 22 05/24/2014   No results  found for: TROPONINI Lab Results  Component Value Date   WBC 11.5* 05/24/2014   HGB 13.9 05/24/2014   HCT 43.0 05/24/2014   MCV 87.0 05/24/2014   PLT 345 05/24/2014    Radiology:  CXR:  Normal heart size and pulmonary vascularity. Calcified granuloma demonstrated anteriorly on the lateral view, probably in the left lung. No focal airspace disease or consolidation in the lungs. No blunting of costophrenic angles. No pneumothorax. Small esophageal hiatal hernia behind the heart.  EKG:  Sinus rhythm, rate 101, axis within normal limits, intervals within normal limits  except for mildly short PR interval, nonspecific diffuse T-wave flattening.  ASSESSMENT AND PLAN:    CHEST PAIN:   Atypical greater than typical features. I agree with the previous assessment that stress perfusion imaging would be preferable to rule out obstructive coronary disease. I will schedule this for the morning.  HYPOTHYROIDISM:  I will order a TSH.  CAROTID STENOSIS:  Carotid Doppler can be done as an out patient.   SignedMinus Breeding 05/24/2014, 1:13 PM

## 2014-05-24 NOTE — ED Notes (Signed)
EKG given to EDP Palumbo for review

## 2014-05-25 ENCOUNTER — Observation Stay (HOSPITAL_COMMUNITY): Payer: 59

## 2014-05-25 DIAGNOSIS — R072 Precordial pain: Secondary | ICD-10-CM | POA: Diagnosis not present

## 2014-05-25 DIAGNOSIS — R079 Chest pain, unspecified: Principal | ICD-10-CM

## 2014-05-25 DIAGNOSIS — R0602 Shortness of breath: Secondary | ICD-10-CM | POA: Diagnosis not present

## 2014-05-25 LAB — NM MYOCAR MULTI W/SPECT W/WALL MOTION / EF
CHL CUP NUCLEAR SSS: 1
LHR: 0.21
LV dias vol: 58 mL
LV sys vol: 17 mL
NUC STRESS EF: 71 %
NUC STRESS TID: 0.89
SDS: 1
SRS: 0

## 2014-05-25 LAB — TSH: TSH: 4.468 u[IU]/mL (ref 0.350–4.500)

## 2014-05-25 LAB — TROPONIN I

## 2014-05-25 MED ORDER — POTASSIUM CHLORIDE CRYS ER 20 MEQ PO TBCR
20.0000 meq | EXTENDED_RELEASE_TABLET | Freq: Every day | ORAL | Status: DC
  Administered 2014-05-25 – 2014-05-26 (×2): 20 meq via ORAL
  Filled 2014-05-25 (×2): qty 1

## 2014-05-25 MED ORDER — REGADENOSON 0.4 MG/5ML IV SOLN
INTRAVENOUS | Status: AC
  Filled 2014-05-25: qty 5

## 2014-05-25 MED ORDER — TECHNETIUM TC 99M SESTAMIBI - CARDIOLITE
30.0000 | Freq: Once | INTRAVENOUS | Status: AC | PRN
  Administered 2014-05-25: 30 via INTRAVENOUS

## 2014-05-25 MED ORDER — REGADENOSON 0.4 MG/5ML IV SOLN
0.4000 mg | Freq: Once | INTRAVENOUS | Status: AC
  Administered 2014-05-25: 0.4 mg via INTRAVENOUS

## 2014-05-25 MED ORDER — TECHNETIUM TC 99M SESTAMIBI GENERIC - CARDIOLITE
10.0000 | Freq: Once | INTRAVENOUS | Status: AC | PRN
  Administered 2014-05-25: 10 via INTRAVENOUS

## 2014-05-25 NOTE — Progress Notes (Addendum)
Patient Name: Connie Dillon Date of Encounter: 05/25/2014  PROBLEM LIST  Principal Problem:   Chest pain Active Problems:   Hypothyroidism   History of syncope   Precordial chest pain     SUBJECTIVE Some fluttering overnight. No chest pain.  CURRENT MEDS . gabapentin  600 mg Oral TID  . levothyroxine  50 mcg Oral QAC breakfast  . sertraline  100 mg Oral Daily    OBJECTIVE  Filed Vitals:   05/24/14 1239 05/24/14 1942 05/25/14 0010 05/25/14 0315  BP: 135/75 141/75 130/79 129/70  Pulse: 84 79 72 90  Temp: 97.7 F (36.5 C) 98.4 F (36.9 C) 98.3 F (36.8 C)   TempSrc: Oral Oral Oral   Resp: 20 22 19 20   Height:      Weight: 160 lb 14.4 oz (72.984 kg)   157 lb 10.1 oz (71.5 kg)  SpO2: 97% 98% 94% 98%    Intake/Output Summary (Last 24 hours) at 05/25/14 0823 Last data filed at 05/25/14 0700  Gross per 24 hour  Intake    290 ml  Output   1150 ml  Net   -860 ml   Filed Weights   05/24/14 1146 05/24/14 1239 05/25/14 0315  Weight: 161 lb (73.029 kg) 160 lb 14.4 oz (72.984 kg) 157 lb 10.1 oz (71.5 kg)    PHYSICAL EXAM  GEN: Well nourished, well developed, in no acute distress. HEENT: normal. Neck: Supple, no JVD  Cardiac: RRR, no murmurs, rubs, or gallops. No edema.     Respiratory:  Respirations regular and unlabored, clear to auscultation bilaterally. GI: Soft, nontender, nondistended MS: no deformity or atrophy. Skin: warm and dry, no rash. Neuro:  Strength and sensation are intact. Psych: Anxious, Tearful.   Accessory Clinical Findings  CBC  Recent Labs  05/24/14 0612 05/24/14 1555  WBC 11.5* 12.0*  HGB 13.9 13.2  HCT 43.0 41.6  MCV 87.0 86.0  PLT 345 253   Basic Metabolic Panel  Recent Labs  05/24/14 0612 05/24/14 1555  NA 138  --   K 3.5  --   CL 104  --   CO2 22  --   GLUCOSE 199*  --   BUN 15  --   CREATININE 0.73 0.73  CALCIUM 9.2  --    Liver Function Tests No results for input(s): AST, ALT, ALKPHOS, BILITOT, PROT,  ALBUMIN in the last 72 hours. No results for input(s): LIPASE, AMYLASE in the last 72 hours. Cardiac Enzymes  Recent Labs  05/24/14 1555 05/24/14 2142 05/25/14 0318  TROPONINI <0.03 <0.03 <0.03   BNP (last 3 results)  Recent Labs  05/24/14 0612  BNP 58.5   D-Dimer  Recent Labs  05/24/14 0618  DDIMER <0.27   Hemoglobin A1C No results for input(s): HGBA1C in the last 72 hours. Fasting Lipid Panel No results for input(s): CHOL, HDL, LDLCALC, TRIG, CHOLHDL, LDLDIRECT in the last 72 hours. Thyroid Function Tests  Recent Labs  05/25/14 0318  TSH 4.468    TELE NSR Tachycardic   RADIOLOGY/STUDIES  Dg Chest 2 View  05/24/2014   CLINICAL DATA:  Center to left-sided chest pain on off for a few months. Shortness of breath. Cough.  EXAM: CHEST  2 VIEW  COMPARISON:  05/19/2014  FINDINGS: Normal heart size and pulmonary vascularity. Calcified granuloma demonstrated anteriorly on the lateral view, probably in the left lung. No focal airspace disease or consolidation in the lungs. No blunting of costophrenic angles. No pneumothorax. Small esophageal hiatal hernia behind the  heart.  IMPRESSION: No active cardiopulmonary disease.   Electronically Signed   By: Lucienne Capers M.D.   On: 05/24/2014 06:43   Dg Chest 2 View  05/19/2014   CLINICAL DATA:  Chest pain and shortness of breath.  EXAM: CHEST  2 VIEW  COMPARISON:  None.  FINDINGS: The heart size and mediastinal contours are within normal limits. Both lungs are clear. Calcified granuloma within the anterior mediastinum is again noted. The visualized skeletal structures are unremarkable.  IMPRESSION: No active cardiopulmonary disease.   Electronically Signed   By: Kerby Moors M.D.   On: 05/19/2014 15:58    PATIENT SUMMARY  59 year old female with a history of hyperlipidemia, anxiety/depression, hypothyroidism and dermatofibrosarcoma protuberans status post wide excision with negative margins 2012. She has no history of CAD. She  had recently seen Dr. Sallyanne Kuster for chest pain and new EKG changes. Outpatient stress testing was planned. She then presented to the emergency room yesterday with continued chest pain occurring with activity and at rest. She has ruled out for myocardial infarction. D-dimer is negative. Chest x-ray is unremarkable.  ASSESSMENT AND PLAN  1. Chest pain: Myocardial infarction ruled out. Myoview planned for later today. 2. Carotid stenosis: She has a previous history of carotid duplex demonstrating mild bilateral plaque. Outpatient carotid Dopplers are planned. 3. Hypothyroidism: TSH normal.  Signed, Richardson Dopp, PA-C  05/25/2014, 8:23 AM

## 2014-05-25 NOTE — Progress Notes (Addendum)
Patient Name: Connie Dillon Date of Encounter: 05/25/2014     Principal Problem:   Chest pain Active Problems:   Hypothyroidism   History of syncope   Precordial chest pain    SUBJECTIVE  The patient has finished her Lexiscan Myoview stress test.  Results are pending.  He has not yet had her echocardiogram. I reviewed her EKGs.  They show various degrees of nonspecific ST segment depression.  Her serum potassium is borderline low at 3.5 which may be contributing to the EKG changes.  We will give her potassium supplementation.  She had recent lab work at Dr. Delene Ruffini office including normal TSH and free T4. She is extremely anxious.  As per Connie Dillon's previous note, the patient was very tearful during the stress test. CURRENT MEDS . gabapentin  600 mg Oral TID  . levothyroxine  50 mcg Oral QAC breakfast  . regadenoson      . sertraline  100 mg Oral Daily    OBJECTIVE  Filed Vitals:   05/25/14 0940 05/25/14 0942 05/25/14 0944 05/25/14 0947  BP: 137/57 133/79 138/82 140/80  Pulse:      Temp:      TempSrc:      Resp:      Height:      Weight:      SpO2:        Intake/Output Summary (Last 24 hours) at 05/25/14 1146 Last data filed at 05/25/14 0700  Gross per 24 hour  Intake    290 ml  Output   1150 ml  Net   -860 ml   Filed Weights   05/24/14 1146 05/24/14 1239 05/25/14 0315  Weight: 161 lb (73.029 kg) 160 lb 14.4 oz (72.984 kg) 157 lb 10.1 oz (71.5 kg)    PHYSICAL EXAM  General: Pleasant, NAD.  Very anxious Neuro: Alert and oriented X 3. Moves all extremities spontaneously. Psych: Normal affect. HEENT:  Normal  Neck: Supple without bruits or JVD. Lungs:  Resp regular and unlabored, CTA.  Large vertical scar on right posterior thorax from previous removal of a large lipoma. Heart: RRR no s3, s4, or murmurs. Abdomen: Soft, non-tender, non-distended, BS + x 4.  Extremities: No clubbing, cyanosis or edema. DP/PT/Radials 2+ and equal  bilaterally.  Accessory Clinical Findings  CBC  Recent Labs  05/24/14 0612 05/24/14 1555  WBC 11.5* 12.0*  HGB 13.9 13.2  HCT 43.0 41.6  MCV 87.0 86.0  PLT 345 440   Basic Metabolic Panel  Recent Labs  05/24/14 0612 05/24/14 1555  NA 138  --   K 3.5  --   CL 104  --   CO2 22  --   GLUCOSE 199*  --   BUN 15  --   CREATININE 0.73 0.73  CALCIUM 9.2  --    Liver Function Tests No results for input(s): AST, ALT, ALKPHOS, BILITOT, PROT, ALBUMIN in the last 72 hours. No results for input(s): LIPASE, AMYLASE in the last 72 hours. Cardiac Enzymes  Recent Labs  05/24/14 1555 05/24/14 2142 05/25/14 0318  TROPONINI <0.03 <0.03 <0.03   BNP Invalid input(s): POCBNP D-Dimer  Recent Labs  05/24/14 0618  DDIMER <0.27   Hemoglobin A1C No results for input(s): HGBA1C in the last 72 hours. Fasting Lipid Panel No results for input(s): CHOL, HDL, LDLCALC, TRIG, CHOLHDL, LDLDIRECT in the last 72 hours. Thyroid Function Tests  Recent Labs  05/25/14 0318  TSH 4.468    TELE  Normal sinus rhythm  ECG  Normal sinus rhythm.  Nonspecific ST-T wave changes.  Radiology/Studies  Dg Chest 2 View  05/24/2014   CLINICAL DATA:  Center to left-sided chest pain on off for a few months. Shortness of breath. Cough.  EXAM: CHEST  2 VIEW  COMPARISON:  05/19/2014  FINDINGS: Normal heart size and pulmonary vascularity. Calcified granuloma demonstrated anteriorly on the lateral view, probably in the left lung. No focal airspace disease or consolidation in the lungs. No blunting of costophrenic angles. No pneumothorax. Small esophageal hiatal hernia behind the heart.  IMPRESSION: No active cardiopulmonary disease.   Electronically Signed   By: Lucienne Capers M.D.   On: 05/24/2014 06:43   Dg Chest 2 View  05/19/2014   CLINICAL DATA:  Chest pain and shortness of breath.  EXAM: CHEST  2 VIEW  COMPARISON:  None.  FINDINGS: The heart size and mediastinal contours are within normal limits.  Both lungs are clear. Calcified granuloma within the anterior mediastinum is again noted. The visualized skeletal structures are unremarkable.  IMPRESSION: No active cardiopulmonary disease.   Electronically Signed   By: Kerby Moors M.D.   On: 05/19/2014 15:58    ASSESSMENT AND PLAN  1.  Atypical chest pain 2.  Abnormal EKG with nonspecific ST-T wave changes 3.  Borderline hypokalemia at 3.5 on admission 4.  Hypothyroidism, on Synthroid 5.  Past history of cardiac duplex sonography in 2007 showing mild plaque without significant stenoses. Plan: Await results of echo and Myoview.  If negative, she could be discharged later today and she can get her carotid duplex as an outpatient.  Follow-up with Dr.Croitoru.  Signed, Darlin Coco MD  Echo not done today. It will be done first thing in am. Her myoview is low risk. Will recheck EKG in am.  Nonspecific ST changes may be related to low K. Will recheck BMET in am. Anticipate home in am. Discussed with patient.

## 2014-05-25 NOTE — Progress Notes (Signed)
Patient was tearful and appeared anxious after not being able to complete the treadmill stating is was too fast from the very begining. The test was transitioned to a Lexiscan and still remained anxious and tearful throughout the second part of the test. She was given a drink and snack after the test.

## 2014-05-25 NOTE — Progress Notes (Addendum)
Patient VERY ANXIOUS and TEARFUL. Was unable to walk on treadmill, states it was too fast. Shaking and emotional throughout the test. Changed to [place]. She was anxious and tearful throughout the test. Scintigraphy to follow.

## 2014-05-26 ENCOUNTER — Observation Stay (HOSPITAL_COMMUNITY): Payer: 59

## 2014-05-26 DIAGNOSIS — E038 Other specified hypothyroidism: Secondary | ICD-10-CM

## 2014-05-26 DIAGNOSIS — R079 Chest pain, unspecified: Secondary | ICD-10-CM | POA: Diagnosis not present

## 2014-05-26 DIAGNOSIS — R0782 Intercostal pain: Secondary | ICD-10-CM | POA: Diagnosis not present

## 2014-05-26 DIAGNOSIS — Z9189 Other specified personal risk factors, not elsewhere classified: Secondary | ICD-10-CM

## 2014-05-26 DIAGNOSIS — R072 Precordial pain: Secondary | ICD-10-CM | POA: Diagnosis not present

## 2014-05-26 DIAGNOSIS — R0602 Shortness of breath: Secondary | ICD-10-CM | POA: Diagnosis not present

## 2014-05-26 LAB — BASIC METABOLIC PANEL
ANION GAP: 8 (ref 5–15)
BUN: 14 mg/dL (ref 6–20)
CALCIUM: 9 mg/dL (ref 8.9–10.3)
CO2: 28 mmol/L (ref 22–32)
Chloride: 103 mmol/L (ref 101–111)
Creatinine, Ser: 0.73 mg/dL (ref 0.44–1.00)
GFR calc Af Amer: >60 mL/min (ref >60)
GFR calc non Af Amer: >60 mL/min (ref >60)
Glucose, Bld: 110 mg/dL — ABNORMAL HIGH (ref 65–99)
Potassium: 4.3 mmol/L (ref 3.5–5.1)
Sodium: 139 mmol/L (ref 135–145)

## 2014-05-26 NOTE — Progress Notes (Signed)
Echocardiogram 2D Echocardiogram has been performed.  Connie Dillon 05/26/2014, 8:46 AM

## 2014-05-26 NOTE — Progress Notes (Signed)
    Subjective: Well rested.   Objective: Vital signs in last 24 hours: Temp:  [98 F (36.7 C)-98.3 F (36.8 C)] 98.3 F (36.8 C) (05/16 0515) Pulse Rate:  [74-89] 74 (05/16 0515) Resp:  [18] 18 (05/16 0515) BP: (109-140)/(47-82) 109/47 mmHg (05/16 0515) SpO2:  [94 %-96 %] 94 % (05/16 0515) Weight:  [156 lb 12.8 oz (71.124 kg)] 156 lb 12.8 oz (71.124 kg) (05/16 0515) Last BM Date: 05/25/14  Intake/Output from previous day: 05/15 0701 - 05/16 0700 In: 720 [P.O.:720] Out: -  Intake/Output this shift:    Medications Scheduled Meds: . gabapentin  600 mg Oral TID  . levothyroxine  50 mcg Oral QAC breakfast  . potassium chloride  20 mEq Oral Daily  . sertraline  100 mg Oral Daily   Continuous Infusions:  PRN Meds:.acetaminophen, dicyclomine, guaiFENesin, ondansetron (ZOFRAN) IV, pantoprazole, traMADol-acetaminophen  PE: General appearance: alert, cooperative and no distress Lungs: clear to auscultation bilaterally Heart: regular rate and rhythm, S1, S2 normal, no murmur, click, rub or gallop Extremities: No LEE Pulses: 2+ and symmetric Skin: Warm and dry Neurologic: Grossly normal  Lab Results:   Recent Labs  05/24/14 0612 05/24/14 1555  WBC 11.5* 12.0*  HGB 13.9 13.2  HCT 43.0 41.6  PLT 345 332   BMET  Recent Labs  05/24/14 0612 05/24/14 1555 05/26/14 0338  NA 138  --  139  K 3.5  --  4.3  CL 104  --  103  CO2 22  --  28  GLUCOSE 199*  --  110*  BUN 15  --  14  CREATININE 0.73 0.73 0.73  CALCIUM 9.2  --  9.0     Assessment/Plan  Principal Problem:   Chest pain Active Problems:   Hypothyroidism   History of syncope   Precordial chest pain   HX of PAD with mild plaque in Carotids     SP low risk nuclear stress test yesterday.  Troponin negative x3.  TSH WNL.  OP carotid follow up already schduled. Echo being completed now.  DC home this morning.     LOS: 2 days    HAGER, BRYAN PA-C 05/26/2014 8:16 AM  I saw & evaluated the patient  this AM on rounds along with Mr. Samara Snide, [place].   Together, we reviewed the chart & all available data. Echo pending, Myoview Low Risk with normal EF   I suspect that her CP is more MSK / Costochondritis related - recommend NSAIDS.  Not sure about the sweating - likely not cardiac issue.   Leonie Man, M.D., M.S. Interventional Cardiologist   Pager # [phone]

## 2014-05-26 NOTE — Discharge Instructions (Signed)

## 2014-05-26 NOTE — Discharge Summary (Signed)
Physician Discharge Summary     Cardiologist:  Croitoru  Patient ID: Connie Dillon MRN: [id number] DOB/AGE: 61-Apr-1957 61 y.o.  Admit date: 05/24/2014 Discharge date: 05/26/2014  Admission Diagnoses:  Chest Pain  Discharge Diagnoses:  Principal Problem:   Chest pain Active Problems:   Hypothyroidism   History of syncope   Discharged Condition: stable  Hospital Course:   The patient came to [facility] ED with chest pain. She has no history of CAD. However she did recently see Dr. Loletha Grayer with chest pain. She was thought to have somewhat atypical symptoms. However, she had some new EKG changes compared with an old EKG and with risk factors she was going to have a perfusion study. She came to the ED today because of continued chest pain. She has had pain for weeks off and on. It seems to come in waves. It will happen at rest. It happens if she does activity. She reports that it is a sharp midsternal discomfort. She is concerned because it is happening and waking her from her sleep as well. She doesn't describe radiation to her jaw or to her arms. Other symptoms have included increasing fatigue. He's had some exercise tolerance and shortness of breath with activities. She's not describing however PND or orthopnea. Very concerned about excessive diaphoresis she's having with even mild activity. This is the same symptoms she described previously during her recent clinic visit but because it happened for the third time in a week he presented to the emergency room. Here her enzymes have thus far been negative. EKG demonstrates some nonspecific changes.  The patient was admitted and ruled out for MI.  Stress test was low risk with EF of 55-65%.  Echo completed and results pending. We will review once read.  She will follow up as scheduled for carotid dopplers.  NSAIDs recommended short term for possible costochondritis. The patient was seen by Dr. Ellyn Hack who felt she was stable for DC  home.   Consults: None  Significant Diagnostic Studies:  Stress Test:  Isotope administration The isotope used for nuclear imaging was [id number] Sestamibi. IV Site: left antecubital fossa.Rest isotope was administered on 05/25/2014 at 07:40 with an IV injection of 10 mCi. Rest SPECT images were obtained approximately 45 minutes post tracer injection. Stress isotope was administered on 05/25/2014 at 09:45 with an IV injection of 30 mCi Stress SPECT images were obtained approximately 30 minutes post tracer injection.    Nuclear Study Quality Overall image quality is good.    Nuclear Measurements Study was gated.    Study Impression The study is normal. This is a low risk study. LV cavity size is normal. The left ventricular ejection fraction is normal (55-65%).       Treatments: See above  Discharge Exam: Blood pressure 109/47, pulse 74, temperature 98.3 F (36.8 C), temperature source Oral, resp. rate 18, height 5\' 4"  (1.626 m), weight 156 lb 12.8 oz (71.124 kg), SpO2 94 %.   Disposition: 07-Left Against Medical Advice      Discharge Instructions    Diet - low sodium heart healthy    Complete by:  As directed             Medication List    TAKE these medications        dicyclomine 20 MG tablet  Commonly known as:  BENTYL  Take 1 tablet (20 mg total) by mouth every 6 (six) hours as needed for spasms.     gabapentin 300 MG capsule  Commonly  known as:  NEURONTIN  Take 600 mg by mouth 3 (three) times daily.     ibuprofen 200 MG tablet  Commonly known as:  ADVIL,MOTRIN  Take 400 mg by mouth every 6 (six) hours as needed. Pain.     levothyroxine 50 MCG tablet  Commonly known as:  SYNTHROID, LEVOTHROID  Take 1 tablet (50 mcg total) by mouth daily.     loratadine 10 MG tablet  Commonly known as:  CLARITIN  Take 10 mg by mouth daily as needed. Allergies.     pantoprazole 40 MG tablet  Commonly known as:  PROTONIX  Take 40 mg by mouth daily as needed (acid reflux).      sertraline 50 MG tablet  Commonly known as:  ZOLOFT  Take 100 mg by mouth daily.     traMADol-acetaminophen 37.5-325 MG per tablet  Commonly known as:  ULTRACET  Take 1 tablet by mouth daily as needed for moderate pain.       Follow-up Information    Follow up with Sanda Klein, MD On 06/30/2014.   Specialty:  Cardiology   Why:  4:00 PM   Contact information:   [address] [place] [address] [phone]      Greater than 30 minutes was spent completing the patient's discharge.    SignedEMALIE, MCWETHY, [place] 05/26/2014, 9:51 AM

## 2014-05-28 ENCOUNTER — Encounter: Payer: Self-pay | Admitting: Cardiovascular Disease

## 2014-05-28 ENCOUNTER — Ambulatory Visit (INDEPENDENT_AMBULATORY_CARE_PROVIDER_SITE_OTHER): Payer: 59 | Admitting: Nurse Practitioner

## 2014-05-28 ENCOUNTER — Telehealth: Payer: Self-pay | Admitting: Cardiovascular Disease

## 2014-05-28 ENCOUNTER — Encounter: Payer: Self-pay | Admitting: Nurse Practitioner

## 2014-05-28 VITALS — BP 110/86 | HR 96 | Ht 64.0 in | Wt 159.8 lb

## 2014-05-28 DIAGNOSIS — R1011 Right upper quadrant pain: Secondary | ICD-10-CM

## 2014-05-28 NOTE — Progress Notes (Signed)
CARDIOLOGY OFFICE NOTE  Date:  05/28/2014    Connie Dillon Date of Birth: Nov 01, 1955 Medical Record #[id number]  PCP:  Irven Shelling, MD  Cardiologist:  Croitoru    Chief Complaint  Patient presents with  . Chest Pain    Follow up from hospital - seen for Dr. Sallyanne Kuster    History of Present Illness: Connie Dillon is a 59 y.o. female who presents today for a post hospital visit. This was to be a TOC visit but phone call not documented appropriately. She is seen for Dr. Sallyanne Kuster. She has no history of CAD.  Dr. Sallyanne Kuster saw her earlier this month with lots of somatic complaints. She was set up for stress testing due to abnormal EKG and risk factors.   She presented to [facility] ED with chest pain.  She came to the ED today because of continued chest pain. It was the same symptoms she described previously during her recent clinic visit but because it happened for the third time in a week she presented to the emergency room. Here her enzymes have thus far been negative. EKG demonstrates some nonspecific changes. She was admitted and ruled out for MI. Stress test was low risk with EF of 55-65%. Echo completed and results pending.  She will follow up as scheduled for carotid dopplers. NSAIDs were recommended short term for possible costochondritis.   Comes back today. Here alone. Very anxious. Does not like crowds or loud noises. Continues to have chest pain - this is worse with pressing on her chest and with twisting/turning. Wanting to know all her test results. Not clear why her carotid doppler was cancelled. Sees Dr. Laurann Montana for primary care.  Past Medical History  Diagnosis Date  . Hyperlipidemia   . Hypothyroid   . Anxiety   . History of SCC (squamous cell carcinoma) of skin   . Depression   . 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 Ocie Cornfield in 10/2010. [facility])  . Personal history of colonic polyp-Sessile serrated  polyp 04/30/2013  . Benign fundic gland polyps of stomach 05/06/2013    Past Surgical History  Procedure Laterality Date  . Cholecystectomy    . Lipoma removal  2011  . Laparoscopic endometriosis fulguration  1980's  . Wide excision of dermatofibrosarcoma of right flank  10/2010    by a Dr. Ocie Cornfield in [facility].      Medications: Current Outpatient Prescriptions  Medication Sig Dispense Refill  . dicyclomine (BENTYL) 20 MG tablet Take 1 tablet (20 mg total) by mouth every 6 (six) hours as needed for spasms. 120 tablet 11  . gabapentin (NEURONTIN) 300 MG capsule Take 600 mg by mouth 3 (three) times daily.    Marland Kitchen ibuprofen (ADVIL,MOTRIN) 200 MG tablet Take 400 mg by mouth every 6 (six) hours as needed. Pain.    Marland Kitchen levothyroxine (SYNTHROID, LEVOTHROID) 50 MCG tablet Take 1 tablet (50 mcg total) by mouth daily. 30 tablet 0  . loratadine (CLARITIN) 10 MG tablet Take 10 mg by mouth daily as needed. Allergies.    . pantoprazole (PROTONIX) 40 MG tablet Take 40 mg by mouth daily as needed (acid reflux).    . sertraline (ZOLOFT) 50 MG tablet Take 100 mg by mouth daily.     . traMADol-acetaminophen (ULTRACET) 37.5-325 MG per tablet Take 1 tablet by mouth daily as needed for moderate pain.      No current facility-administered medications for this visit.  Allergies: Allergies  Allergen Reactions  . Doxycycline     GERD  . Other     All Mycins  . Peanuts [Peanut Oil] Hives    ALL NUTS.    . Statins     Gain weight/muscle pains  . Zicam Cold Remedy [Erysidoron #1] Hives and Itching  . Amoxicillin Hives and Rash  . Salmon [Fish Allergy] Rash  . Sudafed [Pseudoephedrine Hcl] Palpitations  . Sulfa Antibiotics Hives and Rash    Social History: The patient  reports that she quit smoking about 11 years ago. She has never used smokeless tobacco. She reports that she does not drink alcohol or use illicit drugs.   Family History: The patient's family history includes Breast cancer in her paternal  aunt; Colon cancer in her paternal aunt; Heart attack (age of onset: 53) in her father; Hyperlipidemia in her father; Hypertension in her father; Stroke in her father.   Review of Systems: Please see the history of present illness.     All other systems are reviewed and negative.   Physical Exam: VS:  BP 110/86 mmHg  Pulse 96  Ht 5\' 4"  (1.626 m)  Wt 159 lb 12.8 oz (72.485 kg)  BMI 27.42 kg/m2  SpO2 98% .  BMI Body mass index is 27.42 kg/(m^2).  Wt Readings from Last 3 Encounters:  05/28/14 159 lb 12.8 oz (72.485 kg)  05/26/14 156 lb 12.8 oz (71.124 kg)  05/22/14 161 lb 14.4 oz (73.437 kg)    General: She seems anxious. She is in no acute distress.  HEENT: Normal. Neck: Supple, no JVD, carotid bruits, or masses noted.  Cardiac: Regular rate and rhythm. No murmurs, rubs, or gallops. No edema. She has palpable chest pain.  Respiratory:  Lungs are clear to auscultation bilaterally with normal work of breathing.  GI: Soft and nontender.  MS: No deformity or atrophy. Gait and ROM intact. Skin: Warm and dry. Color is normal.  Neuro:  Strength and sensation are intact and no gross focal deficits noted.  Psych: Alert, appropriate and with normal affect.   LABORATORY DATA:  EKG:  EKG is not ordered today.   Lab Results  Component Value Date   WBC 12.0* 05/24/2014   HGB 13.2 05/24/2014   HCT 41.6 05/24/2014   PLT 332 05/24/2014   GLUCOSE 110* 05/26/2014   ALT 44 04/11/2013   AST 33 04/11/2013   NA 139 05/26/2014   K 4.3 05/26/2014   CL 103 05/26/2014   CREATININE 0.73 05/26/2014   BUN 14 05/26/2014   CO2 28 05/26/2014   TSH 4.468 05/25/2014    BNP (last 3 results)  Recent Labs  05/24/14 0612  BNP 58.5    ProBNP (last 3 results) No results for input(s): PROBNP in the last 8760 hours.   Other Studies Reviewed Today:  Echo Study Conclusions from 05/2014  - Left ventricle: The cavity size was normal. Systolic function was normal. Wall motion was normal; there  were no regional wall motion abnormalities. Left ventricular diastolic function parameters were normal.   MyoviewStudy Impression The study is normal. This is a low risk study. LV cavity size is normal. The left ventricular ejection fraction is normal (55-65%).   Assessment/Plan: 1. Atypical chest pain - with negative Myoview and negative echo - she has palpable chest wall pain - I have suggested she use an NSAID BID for a week. See PCP if persists. I have tried to get her doppler study rescheduled and she has refused. We  will see her back prn.   2. Anxiety  Current medicines are reviewed with the patient today.  The patient does not have concerns regarding medicines other than what has been noted above.  The following changes have been made:  See above.  Labs/ tests ordered today include:   No orders of the defined types were placed in this encounter.     Disposition:   FU prn.  Patient is agreeable to this plan and will call if any problems develop in the interim.   Signed: Burtis Junes, RN, ANP-C 05/28/2014 2:15 PM  [facility] [address] [place], [place]  [address] Phone: [phone] Fax: [phone]

## 2014-05-28 NOTE — Telephone Encounter (Addendum)
TOC phone call completed - pt given directions for appt today. Pt had no questions regarding medications, stated understanding of hospital discharge instructions, appt time/loc/provider.

## 2014-05-28 NOTE — Telephone Encounter (Signed)
Needs a TOC phone call .. Thanks  °

## 2014-05-28 NOTE — Patient Instructions (Signed)
We will be checking the following labs today - NONE   Medication Instructions:    Continue with your current medicines.     Testing/Procedures To Be Arranged:  Needs to get her carotid doppler (ordered by Dr. Loletha Grayer.) rescheduled  Follow-Up:   We will see you back as needed.   Follow up with Dr. Laurann Montana as needed    Other Special Instructions:   N/A  Call the [place] office at [phone] if you have any questions, problems or concerns.

## 2014-05-29 ENCOUNTER — Telehealth: Payer: Self-pay | Admitting: Cardiovascular Disease

## 2014-05-29 ENCOUNTER — Other Ambulatory Visit: Payer: Self-pay | Admitting: Obstetrics & Gynecology

## 2014-05-29 ENCOUNTER — Other Ambulatory Visit (HOSPITAL_COMMUNITY)
Admission: RE | Admit: 2014-05-29 | Discharge: 2014-05-29 | Disposition: A | Discharge status: Home / Self Care | Payer: 59 | Source: Ambulatory Visit | Attending: Obstetrics and Gynecology | Admitting: Obstetrics and Gynecology

## 2014-05-29 ENCOUNTER — Other Ambulatory Visit (HOSPITAL_COMMUNITY)
Admission: RE | Admit: 2014-05-29 | Discharge: 2014-05-29 | Disposition: A | Discharge status: Home / Self Care | Payer: 59 | Source: Ambulatory Visit | Attending: Obstetrics & Gynecology | Admitting: Obstetrics & Gynecology

## 2014-05-29 DIAGNOSIS — Z1151 Encounter for screening for human papillomavirus (HPV): Secondary | ICD-10-CM | POA: Insufficient documentation

## 2014-05-29 DIAGNOSIS — Z01419 Encounter for gynecological examination (general) (routine) without abnormal findings: Secondary | ICD-10-CM | POA: Insufficient documentation

## 2014-05-29 DIAGNOSIS — R87810 Cervical high risk human papillomavirus (HPV) DNA test positive: Secondary | ICD-10-CM | POA: Diagnosis present

## 2014-05-29 DIAGNOSIS — I6529 Occlusion and stenosis of unspecified carotid artery: Secondary | ICD-10-CM

## 2014-05-29 NOTE — Telephone Encounter (Signed)
Spoke to patient - reordered doppler. This was cancelled for some reason after she'd had other tests done - she still wants to have this.  Pt was dissatisfied w/ care received at [facility] appt yesterday. Feels a lot of information was not explained well and she was treated dismissively.  Her big concern was trying to figure out if there was any significance to her recent EKGs - she cites changes noted between those performed in her PCP's office and the ER - I informed her I could not interpret these but would defer to her cardiologist to review any concerns.  Will send note to [facility] for carotid doppler scheduling - she also wants f/u w/ Dr. Loletha Grayer scheduled as soon as possible following doppler.

## 2014-05-29 NOTE — Telephone Encounter (Signed)
Please let her know the ED ECGs were not changed from previous tracings. Please reschedule the carotid study

## 2014-05-29 NOTE — Telephone Encounter (Signed)
Pt apprised of Dr. Victorino December advice.  Note was previously sent to [place] scheduling pool for set up of carotid study.

## 2014-05-29 NOTE — Telephone Encounter (Signed)
Connie Dillon is calling because she has some questions as well as need to know if she should still have her echo and carotid dopplers .Marland Kitchen Please call    Thanks

## 2014-05-30 LAB — CYTOLOGY - PAP

## 2014-06-03 ENCOUNTER — Inpatient Hospital Stay (HOSPITAL_COMMUNITY): Admission: RE | Admit: 2014-06-03 | Payer: Self-pay | Source: Ambulatory Visit

## 2014-06-05 ENCOUNTER — Encounter (HOSPITAL_COMMUNITY): Payer: 59

## 2014-06-05 ENCOUNTER — Other Ambulatory Visit (HOSPITAL_COMMUNITY): Payer: 59

## 2014-06-06 ENCOUNTER — Ambulatory Visit (HOSPITAL_COMMUNITY)
Admission: RE | Admit: 2014-06-06 | Discharge: 2014-06-06 | Disposition: A | Discharge status: Home / Self Care | Payer: 59 | Source: Ambulatory Visit | Attending: Cardiovascular Disease | Admitting: Cardiovascular Disease

## 2014-06-06 ENCOUNTER — Other Ambulatory Visit: Payer: Self-pay | Admitting: Cardiovascular Disease

## 2014-06-06 ENCOUNTER — Encounter (HOSPITAL_COMMUNITY): Payer: Self-pay

## 2014-06-06 DIAGNOSIS — I6529 Occlusion and stenosis of unspecified carotid artery: Secondary | ICD-10-CM

## 2014-06-10 ENCOUNTER — Ambulatory Visit: Payer: Self-pay | Admitting: Cardiology

## 2014-06-12 ENCOUNTER — Telehealth: Payer: Self-pay | Admitting: Hematology and Oncology

## 2014-06-12 ENCOUNTER — Encounter: Payer: Self-pay | Admitting: Hematology and Oncology

## 2014-06-12 NOTE — Telephone Encounter (Signed)
returned call and lvm for pt to call back to r/s appt °

## 2014-06-12 NOTE — Telephone Encounter (Signed)
returned call and lvm for pt confirming appt per pt request on vm.

## 2014-06-13 ENCOUNTER — Ambulatory Visit: Payer: Self-pay | Admitting: Internal Medicine

## 2014-06-16 ENCOUNTER — Ambulatory Visit: Payer: Self-pay | Admitting: Hematology and Oncology

## 2014-06-16 ENCOUNTER — Other Ambulatory Visit: Payer: Self-pay

## 2014-06-19 ENCOUNTER — Encounter: Payer: Self-pay | Admitting: Cardiovascular Disease

## 2014-06-23 ENCOUNTER — Ambulatory Visit: Payer: Self-pay | Admitting: Cardiovascular Disease

## 2014-06-25 ENCOUNTER — Ambulatory Visit: Payer: Self-pay | Admitting: Internal Medicine

## 2014-06-30 ENCOUNTER — Ambulatory Visit: Payer: 59 | Admitting: Cardiovascular Disease

## 2014-07-03 ENCOUNTER — Encounter: Payer: Self-pay | Admitting: Neurology

## 2014-07-03 ENCOUNTER — Other Ambulatory Visit: Payer: Self-pay | Admitting: *Deleted

## 2014-07-03 MED ORDER — GABAPENTIN 300 MG PO CAPS: 600.0000 mg | ORAL_CAPSULE | Freq: Three times a day (TID) | ORAL | Status: DC

## 2014-07-14 ENCOUNTER — Other Ambulatory Visit: Payer: Self-pay | Admitting: Neurology

## 2014-07-15 ENCOUNTER — Other Ambulatory Visit: Payer: Self-pay | Admitting: *Deleted

## 2014-07-15 MED ORDER — GABAPENTIN 800 MG PO TABS: 800.0000 mg | ORAL_TABLET | Freq: Three times a day (TID) | ORAL | Status: DC

## 2014-07-15 NOTE — Telephone Encounter (Signed)
Rx sent 

## 2014-07-22 ENCOUNTER — Telehealth: Payer: Self-pay | Admitting: Hematology and Oncology

## 2014-07-22 NOTE — Telephone Encounter (Signed)
pt called to cx...she did not want to r/s at this time...she will call us back to r/s

## 2014-07-29 ENCOUNTER — Ambulatory Visit: Payer: Self-pay | Admitting: Hematology and Oncology

## 2014-07-29 ENCOUNTER — Other Ambulatory Visit: Payer: Self-pay

## 2014-08-22 ENCOUNTER — Ambulatory Visit: Payer: Self-pay | Admitting: Cardiovascular Disease

## 2014-09-05 ENCOUNTER — Ambulatory Visit: Payer: Self-pay | Admitting: Internal Medicine

## 2014-10-10 ENCOUNTER — Ambulatory Visit: Payer: BC Managed Care – PPO | Admitting: Hematology and Oncology

## 2014-10-10 ENCOUNTER — Other Ambulatory Visit: Payer: BC Managed Care – PPO

## 2015-01-11 DIAGNOSIS — K760 Fatty (change of) liver, not elsewhere classified: Secondary | ICD-10-CM

## 2015-01-11 DIAGNOSIS — I700 Atherosclerosis of aorta: Secondary | ICD-10-CM

## 2015-01-11 HISTORY — DX: Atherosclerosis of aorta: I70.0

## 2015-01-11 HISTORY — DX: Fatty (change of) liver, not elsewhere classified: K76.0

## 2015-01-27 ENCOUNTER — Other Ambulatory Visit: Payer: Self-pay | Admitting: Hematology and Oncology

## 2015-01-27 ENCOUNTER — Telehealth: Payer: Self-pay | Admitting: *Deleted

## 2015-01-27 DIAGNOSIS — Z85831 Personal history of malignant neoplasm of soft tissue: Secondary | ICD-10-CM

## 2015-01-27 NOTE — Telephone Encounter (Signed)
Pt called to say she needs to schedule her yearly follow up. Continues to have the RUQ pain.   Has relocated to [place] [place], [place] and will be available on 2/3 if possible.

## 2015-01-27 NOTE — Telephone Encounter (Signed)
She lives so far away now. There are many cancer centers close to where she lives If she really want follow-up here, I can try to schedule labs and CT early morning on 2/3 and see her in the afternoon Before I place new POF, can you ask if she wants [place] refer her closer to where she lives?

## 2015-01-29 ENCOUNTER — Telehealth: Payer: Self-pay | Admitting: *Deleted

## 2015-01-29 NOTE — Telephone Encounter (Signed)
LVM for pt informing we need to schedule CT and CXR along w/ a lab appt before appt w/ Dr. Alvy Bimler on 2/3.  Can be done on same day but scans will need to be done in the morning to have time for results by the time she sees Dr. Alvy Bimler in the afternoon.  Asked pt to please call nurse back so we can get it rescheduled.

## 2015-01-30 ENCOUNTER — Telehealth: Payer: Self-pay | Admitting: *Deleted

## 2015-01-30 NOTE — Telephone Encounter (Signed)
Pt reports that she went to an MD in [place] today due to pain. Had CT/Xrays done there. Will call back on Monday to let us know if she is coming to see Dr Alvy Bimler

## 2015-02-05 ENCOUNTER — Telehealth: Payer: Self-pay | Admitting: Hematology and Oncology

## 2015-02-05 NOTE — Telephone Encounter (Signed)
Patient called and left voicemail to cancel her 2/3 appointment. I returned her call and left a message for her to call the office if she would like to r/s.

## 2015-02-13 ENCOUNTER — Ambulatory Visit: Payer: Self-pay | Admitting: Hematology and Oncology

## 2015-02-13 ENCOUNTER — Ambulatory Visit (HOSPITAL_COMMUNITY): Payer: Self-pay

## 2015-02-13 ENCOUNTER — Other Ambulatory Visit (HOSPITAL_COMMUNITY): Payer: Self-pay

## 2015-04-11 DIAGNOSIS — A498 Other bacterial infections of unspecified site: Secondary | ICD-10-CM

## 2015-04-11 HISTORY — DX: Other bacterial infections of unspecified site: A49.8

## 2015-08-20 ENCOUNTER — Ambulatory Visit: Payer: Self-pay | Admitting: Physician Assistant

## 2015-10-28 ENCOUNTER — Ambulatory Visit: Payer: Self-pay | Admitting: Internal Medicine

## 2016-02-26 ENCOUNTER — Other Ambulatory Visit: Payer: Self-pay | Admitting: Internal Medicine

## 2016-02-26 ENCOUNTER — Ambulatory Visit
Admission: RE | Admit: 2016-02-26 | Discharge: 2016-02-26 | Disposition: A | Discharge status: Home / Self Care | Payer: Self-pay | Source: Ambulatory Visit | Attending: Internal Medicine | Admitting: Internal Medicine

## 2016-02-26 DIAGNOSIS — R7301 Impaired fasting glucose: Secondary | ICD-10-CM | POA: Diagnosis not present

## 2016-02-26 DIAGNOSIS — M25511 Pain in right shoulder: Secondary | ICD-10-CM | POA: Diagnosis not present

## 2016-02-26 DIAGNOSIS — G629 Polyneuropathy, unspecified: Secondary | ICD-10-CM | POA: Diagnosis not present

## 2016-02-26 DIAGNOSIS — M5489 Other dorsalgia: Secondary | ICD-10-CM

## 2016-02-26 DIAGNOSIS — M549 Dorsalgia, unspecified: Secondary | ICD-10-CM | POA: Diagnosis not present

## 2016-02-26 DIAGNOSIS — F325 Major depressive disorder, single episode, in full remission: Secondary | ICD-10-CM | POA: Diagnosis not present

## 2016-06-27 DIAGNOSIS — R7309 Other abnormal glucose: Secondary | ICD-10-CM | POA: Diagnosis not present

## 2016-08-30 DIAGNOSIS — E782 Mixed hyperlipidemia: Secondary | ICD-10-CM | POA: Diagnosis not present

## 2016-08-30 DIAGNOSIS — Z0000 Encounter for general adult medical examination without abnormal findings: Secondary | ICD-10-CM | POA: Diagnosis not present

## 2016-08-30 DIAGNOSIS — E039 Hypothyroidism, unspecified: Secondary | ICD-10-CM | POA: Diagnosis not present

## 2016-08-30 DIAGNOSIS — R7301 Impaired fasting glucose: Secondary | ICD-10-CM | POA: Diagnosis not present

## 2016-08-30 DIAGNOSIS — E7800 Pure hypercholesterolemia, unspecified: Secondary | ICD-10-CM | POA: Diagnosis not present

## 2016-08-30 DIAGNOSIS — M5136 Other intervertebral disc degeneration, lumbar region: Secondary | ICD-10-CM | POA: Diagnosis not present

## 2016-08-30 DIAGNOSIS — F331 Major depressive disorder, recurrent, moderate: Secondary | ICD-10-CM | POA: Diagnosis not present

## 2016-08-30 DIAGNOSIS — G629 Polyneuropathy, unspecified: Secondary | ICD-10-CM | POA: Diagnosis not present

## 2016-09-22 DIAGNOSIS — M47816 Spondylosis without myelopathy or radiculopathy, lumbar region: Secondary | ICD-10-CM | POA: Diagnosis not present

## 2016-09-29 DIAGNOSIS — F325 Major depressive disorder, single episode, in full remission: Secondary | ICD-10-CM | POA: Diagnosis not present

## 2016-09-29 DIAGNOSIS — E7800 Pure hypercholesterolemia, unspecified: Secondary | ICD-10-CM | POA: Diagnosis not present

## 2016-09-29 DIAGNOSIS — Z79899 Other long term (current) drug therapy: Secondary | ICD-10-CM | POA: Diagnosis not present

## 2016-09-29 DIAGNOSIS — E663 Overweight: Secondary | ICD-10-CM | POA: Diagnosis not present

## 2016-09-29 DIAGNOSIS — R7301 Impaired fasting glucose: Secondary | ICD-10-CM | POA: Diagnosis not present

## 2016-09-29 DIAGNOSIS — Z6823 Body mass index (BMI) 23.0-23.9, adult: Secondary | ICD-10-CM | POA: Diagnosis not present

## 2016-09-29 DIAGNOSIS — I700 Atherosclerosis of aorta: Secondary | ICD-10-CM | POA: Diagnosis not present

## 2016-10-12 DIAGNOSIS — R040 Epistaxis: Secondary | ICD-10-CM | POA: Diagnosis not present

## 2016-10-12 DIAGNOSIS — J019 Acute sinusitis, unspecified: Secondary | ICD-10-CM | POA: Diagnosis not present

## 2016-12-11 DIAGNOSIS — R300 Dysuria: Secondary | ICD-10-CM | POA: Diagnosis not present

## 2016-12-11 DIAGNOSIS — N390 Urinary tract infection, site not specified: Secondary | ICD-10-CM | POA: Diagnosis not present

## 2016-12-19 ENCOUNTER — Ambulatory Visit: Payer: Self-pay | Admitting: Internal Medicine

## 2016-12-22 DIAGNOSIS — J3489 Other specified disorders of nose and nasal sinuses: Secondary | ICD-10-CM | POA: Diagnosis not present

## 2016-12-22 DIAGNOSIS — J410 Simple chronic bronchitis: Secondary | ICD-10-CM | POA: Diagnosis not present

## 2016-12-22 DIAGNOSIS — J3501 Chronic tonsillitis: Secondary | ICD-10-CM | POA: Diagnosis not present

## 2016-12-22 DIAGNOSIS — B370 Candidal stomatitis: Secondary | ICD-10-CM | POA: Diagnosis not present

## 2016-12-22 DIAGNOSIS — J322 Chronic ethmoidal sinusitis: Secondary | ICD-10-CM | POA: Diagnosis not present

## 2016-12-22 DIAGNOSIS — J320 Chronic maxillary sinusitis: Secondary | ICD-10-CM | POA: Diagnosis not present

## 2017-01-06 DIAGNOSIS — R300 Dysuria: Secondary | ICD-10-CM | POA: Diagnosis not present

## 2017-01-24 ENCOUNTER — Ambulatory Visit: Payer: Self-pay | Admitting: Internal Medicine

## 2017-01-24 ENCOUNTER — Telehealth: Payer: Self-pay | Admitting: Internal Medicine

## 2017-01-24 NOTE — Telephone Encounter (Signed)
No charge. 

## 2017-01-26 ENCOUNTER — Other Ambulatory Visit: Payer: Self-pay | Admitting: Nurse Practitioner

## 2017-01-26 ENCOUNTER — Ambulatory Visit
Admission: RE | Admit: 2017-01-26 | Discharge: 2017-01-26 | Disposition: A | Discharge status: Home / Self Care | Payer: Self-pay | Source: Ambulatory Visit | Attending: Nurse Practitioner | Admitting: Nurse Practitioner

## 2017-01-26 DIAGNOSIS — J019 Acute sinusitis, unspecified: Secondary | ICD-10-CM | POA: Diagnosis not present

## 2017-01-26 DIAGNOSIS — J9801 Acute bronchospasm: Secondary | ICD-10-CM

## 2017-01-26 DIAGNOSIS — R05 Cough: Secondary | ICD-10-CM | POA: Diagnosis not present

## 2017-02-02 DIAGNOSIS — L820 Inflamed seborrheic keratosis: Secondary | ICD-10-CM | POA: Diagnosis not present

## 2017-02-10 HISTORY — PX: ESOPHAGOGASTRODUODENOSCOPY (EGD) WITH ESOPHAGEAL DILATION: SHX5812

## 2017-02-13 ENCOUNTER — Telehealth: Payer: Self-pay | Admitting: Internal Medicine

## 2017-02-13 NOTE — Telephone Encounter (Signed)
I scheduled the patient for 02/15/17.  I left the patient a message asking her to call back to confirm the appt date and time.

## 2017-02-14 NOTE — Telephone Encounter (Signed)
Patient notified of appt date and time and she confirmed the times with me

## 2017-02-15 ENCOUNTER — Ambulatory Visit (INDEPENDENT_AMBULATORY_CARE_PROVIDER_SITE_OTHER): Payer: PPO | Admitting: Internal Medicine

## 2017-02-15 ENCOUNTER — Encounter: Payer: Self-pay | Admitting: Internal Medicine

## 2017-02-15 VITALS — BP 100/60 | HR 84 | Ht 64.5 in | Wt 165.0 lb

## 2017-02-15 DIAGNOSIS — K219 Gastro-esophageal reflux disease without esophagitis: Secondary | ICD-10-CM | POA: Diagnosis not present

## 2017-02-15 DIAGNOSIS — R0982 Postnasal drip: Secondary | ICD-10-CM | POA: Diagnosis not present

## 2017-02-15 DIAGNOSIS — R6889 Other general symptoms and signs: Secondary | ICD-10-CM | POA: Diagnosis not present

## 2017-02-15 DIAGNOSIS — R0989 Other specified symptoms and signs involving the circulatory and respiratory systems: Secondary | ICD-10-CM

## 2017-02-15 DIAGNOSIS — R05 Cough: Secondary | ICD-10-CM | POA: Diagnosis not present

## 2017-02-15 DIAGNOSIS — R1310 Dysphagia, unspecified: Secondary | ICD-10-CM

## 2017-02-15 DIAGNOSIS — R053 Chronic cough: Secondary | ICD-10-CM

## 2017-02-15 NOTE — Patient Instructions (Signed)
  You have been scheduled for an endoscopy. Please follow written instructions given to you at your visit today. If you use inhalers (even only as needed), please bring them with you on the day of your procedure.   I appreciate the opportunity to care for you. Carl Gessner, MD, FACG 

## 2017-02-15 NOTE — Progress Notes (Signed)
Connie Dillon 62 y.o. Nov 28, 1955 [id number]  Assessment & Plan:   Encounter Diagnoses  Name Primary?  Marland Kitchen Dysphagia, unspecified type Yes  . Gastroesophageal reflux disease, esophagitis presence not specified   . Chronic cough   . Post-nasal drip   . Chronic throat clearing      Some or all of the symptoms could be attributed to GERD though unlikely.  EGD is appropriate given the constellation of symptoms.  Possible esophageal dilation.  If negative I would consider changing PPI and going to twice daily, and she may need some additional evaluation and treatment of her postnasal drip.  Perhaps a chronic nasal spray versus antihistamine.  The risks and benefits as well as alternatives of endoscopic procedure(s) have been discussed and reviewed. All questions answered. The patient agrees to proceed.  I appreciate the opportunity to care for this patient. CC: [name], MD   Subjective:   Chief Complaint: Swallowing problems cough  HPI The patient is here, last seen in early 2015, now with a 6-13-month history of solid and liquid dysphagia with some odynophagia, globus, chronically sore throat with cough and throat clearing.  No change in medications associated.  No change in her reflux issues.  Remains on pantoprazole.  Denies significant stressors, her elderly mother did pass away but that was expected and really thought to be a blessing.  She gets panicky when this happens sometimes it will occur without swallowing and it will wake her up at night where she will have a sensation of pressure and choking and coughing.  This is a very scary to her because she feels like she is going to die.  Otherwise when it occurs it is embarrassing.  And also somewhat scary.  She does have chronic postnasal drip.  She is never been on any chronic therapy for that.  It sounds like she has tried Mucinex at times because sometimes it is a thick secretion but it can be thin as well.  She will taken  over-the-counter Chlor-Trimeton which will provide some relief of symptoms temporarily.  Weight has varied a little bit but there is no progressive weight loss bleeding etc.  In 2015 she was having a lot of right sided abdominal pain hyperesthesia type pains diarrhea and that is much better.  She had an EGD then as well as a colonoscopy, she had a 5 cm hiatal hernia otherwise normal esophagus and some suspected gastric polyps not proven by biopsy.  The colonoscopy showed a very small sessile serrated adenoma and she is due for a surveillance colonoscopy in around spring 2020.  In January she had a chest x-ray on the 17th indications were cough fever and congestion so I think she was suspected of had an upper respiratory infection if not more and it was negative for any active cardiopulmonary disease there was an old suspected granulomatous 10 mm nodule over the anterior upper lobe that was unchanged from 2014 Allergies  Allergen Reactions  . Cefdinir     C-Diff  . Clindamycin/Lincomycin     C-Diff, dehydration  . Doxycycline     GERD  . Erythrocin [Erythromycin] Hives  . Metformin And Related     diarrhea  . Other     All Mycins  . Peanuts [Peanut Oil] Hives    ALL NUTS.    . Statins     Gain weight/muscle pains  . Zicam Cold Remedy [Erysidoron #1] Hives and Itching  . Amoxicillin Hives and Rash    hives  .  Latex Rash  . Salmon [Fish Allergy] Rash  . Sudafed [Pseudoephedrine Hcl] Palpitations    hyper  . Sulfa Antibiotics Hives and Rash   Current Meds  Medication Sig  .    Marland Kitchen DULoxetine (CYMBALTA) 60 MG capsule Take 60 mg by mouth daily.  Marland Kitchen EPINEPHrine (EPIPEN 2-PAK) 0.3 mg/0.3 mL IJ SOAJ injection Inject 0.3 mg into the muscle as needed.  . gabapentin (NEURONTIN) 400 MG capsule Take 800 mg by mouth 3 (three) times daily.  Marland Kitchen ibuprofen (ADVIL,MOTRIN) 200 MG tablet Take 400 mg by mouth every 6 (six) hours as needed. Pain.  Marland Kitchen levothyroxine (SYNTHROID, LEVOTHROID) 50 MCG tablet Take 1  tablet (50 mcg total) by mouth daily.  Marland Kitchen loratadine (CLARITIN) 10 MG tablet Take 10 mg by mouth daily as needed. Allergies.  . meloxicam (MOBIC) 15 MG tablet Take 15 mg by mouth daily as needed.   . pantoprazole (PROTONIX) 40 MG tablet Take 40 mg by mouth daily as needed (acid reflux).  . rosuvastatin (CRESTOR) 5 MG tablet Take 5 mg by mouth. On Monday, Wed, and Friday   Past Medical History:  Diagnosis Date  . Adenomatous colon polyp   . Anxiety   . Aortic atherosclerosis ([place]) 2017   on CT scan  . Benign fundic gland polyps of stomach 05/06/2013  . Clostridium difficile infection 04/2015  . DDD (degenerative disc disease), lumbar   . Depression   . 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 Ocie Cornfield in 10/2010. [facility])  . GERD (gastroesophageal reflux disease)   . Hepatic steatosis 2017   on CT scan  . History of SCC (squamous cell carcinoma) of skin   . Hyperlipidemia   . Hypothyroid   . IBS (irritable bowel syndrome)   . IFG (impaired fasting glucose)   . Narcotic abuse ([place])    distant history of muscle relaxant, sleeping pill  . Personal history of colonic polyp-Sessile serrated polyp 04/30/2013  . Seasonal allergic rhinitis   . Small fiber polyneuropathy    symmetric, chronic pain   Past Surgical History:  Procedure Laterality Date  . CHOLECYSTECTOMY    . COLONOSCOPY W/ BIOPSIES    . LAPAROSCOPIC ENDOMETRIOSIS FULGURATION  1980's  . lipoma removal  2011  . Wide excision of dermatofibrosarcoma of right flank  10/2010   by a Dr. Ocie Cornfield in [facility].    Social History   Social History Narrative   She lives alone and has two grown children.   She works part-time in [employer] research (medical).   Highest level of education:  College degree   family history includes Alcoholism in her mother; Breast cancer in her paternal aunt; Colon cancer in her paternal aunt; Coronary artery disease in her father; Dementia in her mother; Heart attack (age  of onset: 21) in her father; Hyperlipidemia in her father and sister; Hypertension in her father; Hypothyroidism in her mother; Stroke in her father.   Review of Systems She feels like she gained weight when she was on Crestor so she stopped it and is due to speak to the prescribing physician regarding that sometime in the next couple of weeks.  Review of systems is otherwise negative at this time.  Objective:   Physical Exam @BP  100/60   Pulse 84   Ht 5' 4.5" (1.638 m) Comment: with no shoes  Wt 165 lb (74.8 kg)   BMI 27.88 kg/m @  General:  Well-developed, well-nourished and in no acute distress Eyes:  anicteric.  ENT:   Mouth and posterior pharynx free of lesions.  Neck:   supple w/o thyromegaly or mass.  Lungs: Clear to auscultation bilaterally. Heart:  S1S2, no rubs, murmurs, gallops. Abdomen:  soft, non-tender, no hepatosplenomegaly, hernia, or mass and BS+.  Lymph:  no cervical or supraclavicular adenopathy. Extremities:   no edema, cyanosis or clubbing Neuro:  A&O x 3.  Psych:  appropriate mood and  Affect.   Data Reviewed: As per HPI

## 2017-02-17 ENCOUNTER — Ambulatory Visit (AMBULATORY_SURGERY_CENTER): Payer: PPO | Admitting: Internal Medicine

## 2017-02-17 ENCOUNTER — Encounter: Payer: Self-pay | Admitting: Internal Medicine

## 2017-02-17 VITALS — BP 134/74 | HR 84 | Temp 98.0°F | Resp 10 | Ht 64.5 in | Wt 165.0 lb

## 2017-02-17 DIAGNOSIS — R1310 Dysphagia, unspecified: Secondary | ICD-10-CM

## 2017-02-17 DIAGNOSIS — K222 Esophageal obstruction: Secondary | ICD-10-CM

## 2017-02-17 DIAGNOSIS — E039 Hypothyroidism, unspecified: Secondary | ICD-10-CM | POA: Diagnosis not present

## 2017-02-17 DIAGNOSIS — K219 Gastro-esophageal reflux disease without esophagitis: Secondary | ICD-10-CM | POA: Diagnosis not present

## 2017-02-17 DIAGNOSIS — F419 Anxiety disorder, unspecified: Secondary | ICD-10-CM | POA: Diagnosis not present

## 2017-02-17 MED ORDER — PANTOPRAZOLE SODIUM 40 MG PO TBEC: 40.0000 mg | DELAYED_RELEASE_TABLET | Freq: Two times a day (BID) | ORAL | 3 refills | Status: DC

## 2017-02-17 MED ORDER — SODIUM CHLORIDE 0.9 % IV SOLN: 500.0000 mL | Freq: Once | INTRAVENOUS | Status: DC

## 2017-02-17 NOTE — Patient Instructions (Addendum)
I saw the hiatal hernia again - looks larger. I dilated the esophagus where it meets the stomach but not sure that will fix anything - was worth a try. It looks like the esophagus does not squeeze and relax properly.  We are going to do 3 things now: 1) Change to pantoprazole twice daily 2) Schedule an upper GI series to evaluate the hernia (drink barium and take some xrays) 3) Schedule an esophageal manometry to evaluate the function of the esophagus (a very small tube is inserted through nose for about 20 minutes)   Will contact you when you finish these tests  I appreciate the opportunity to care for you. Gatha Mayer, MD, FACGYOU HAD AN ENDOSCOPIC PROCEDURE TODAY AT [facility]:   Refer to the procedure report that was given to you for any specific questions about what was found during the examination.  If the procedure report does not answer your questions, please call your gastroenterologist to clarify.  If you requested that your care partner not be given the details of your procedure findings, then the procedure report has been included in a sealed envelope for you to review at your convenience later.  YOU SHOULD EXPECT: Some feelings of bloating in the abdomen. Passage of more gas than usual.  Walking can help get rid of the air that was put into your GI tract during the procedure and reduce the bloating. If you had a lower endoscopy (such as a colonoscopy or flexible sigmoidoscopy) you may notice spotting of blood in your stool or on the toilet paper. If you underwent a bowel prep for your procedure, you may not have a normal bowel movement for a few days.  Please Note:  You might notice some irritation and congestion in your nose or some drainage.  This is from the oxygen used during your procedure.  There is no need for concern and it should clear up in a day or so.  SYMPTOMS TO REPORT IMMEDIATELY:   Following upper endoscopy (EGD)  Vomiting of blood or  coffee ground material  New chest pain or pain under the shoulder blades  Painful or persistently difficult swallowing  New shortness of breath  Fever of 100F or higher  Black, tarry-looking stools  For urgent or emergent issues, a gastroenterologist can be reached at any hour by calling [phone].   DIET:  We do recommend a small meal at first, but then you may proceed to your regular diet.  Drink plenty of fluids but you should avoid alcoholic beverages for 24 hours.  ACTIVITY:  You should plan to take it easy for the rest of today and you should NOT DRIVE or use heavy machinery until tomorrow (because of the sedation medicines used during the test).    FOLLOW UP: Our staff will call the number listed on your records the next business day following your procedure to check on you and address any questions or concerns that you may have regarding the information given to you following your procedure. If we do not reach you, we will leave a message.  However, if you are feeling well and you are not experiencing any problems, there is no need to return our call.  We will assume that you have returned to your regular daily activities without incident.  If any biopsies were taken you will be contacted by phone or by letter within the next 1-3 weeks.  Please call us at [phone] if you have  not heard about the biopsies in 3 weeks.    SIGNATURES/CONFIDENTIALITY: You and/or your care partner have signed paperwork which will be entered into your electronic medical record.  These signatures attest to the fact that that the information above on your After Visit Summary has been reviewed and is understood.  Full responsibility of the confidentiality of this discharge information lies with you and/or your care-partner.

## 2017-02-17 NOTE — Op Note (Signed)
[place] Patient Name: Connie Dillon Procedure Date: 02/17/2017 3:39 PM MRN: [id number] Endoscopist: Gatha Mayer , MD Age: 62 Referring MD:  Date of Birth: 27-Jul-1955 Gender: Female Account #: [id number] Procedure:                Upper GI endoscopy Indications:              Dysphagia, Heartburn, Esophageal reflux symptoms                            that persist despite appropriate therapy Medicines:                Propofol per Anesthesia, Monitored Anesthesia Care Procedure:                Pre-Anesthesia Assessment:                           - Prior to the procedure, a History and Physical                            was performed, and patient medications and                            allergies were reviewed. The patient's tolerance of                            previous anesthesia was also reviewed. The risks                            and benefits of the procedure and the sedation                            options and risks were discussed with the patient.                            All questions were answered, and informed consent                            was obtained. Prior Anticoagulants: The patient has                            taken no previous anticoagulant or antiplatelet                            agents. ASA Grade Assessment: II - A patient with                            mild systemic disease. After reviewing the risks                            and benefits, the patient was deemed in                            satisfactory condition to undergo the procedure.  After obtaining informed consent, the endoscope was                            passed under direct vision. Throughout the                            procedure, the patient's blood pressure, pulse, and                            oxygen saturations were monitored continuously. The                            Endoscope was introduced through the mouth, and        advanced to the second part of duodenum. The upper                            GI endoscopy was accomplished without difficulty.                            The patient tolerated the procedure well. Scope In: Scope Out: Findings:                 The examined esophagus was significantly tortuous.                           One moderate benign-appearing, intrinsic stenosis                            was found at the gastroesophageal junction. And was                            traversed. A TTS dilator was passed through the                            scope. Dilation with an 18-19-20 mm balloon dilator                            was performed to 20 mm. The dilation site was                            examined and showed no change. Estimated blood                            loss: none.                           An 8 cm hiatal hernia was present.                           The exam was otherwise without abnormality.                           The cardia and gastric fundus were normal on  retroflexion. Complications:            No immediate complications. Estimated Blood Loss:     Estimated blood loss: none. Impression:               - Tortuous esophagus. suspect dysmotility                           - Benign-appearing esophageal stenosis. Dilated. No                            effect                           - 8 cm hiatal hernia.                           - The examination was otherwise normal.                           - No specimens collected. Recommendation:           - Patient has a contact number available for                            emergencies. The signs and symptoms of potential                            delayed complications were discussed with the                            patient. Return to normal activities tomorrow.                            Written discharge instructions were provided to the                            patient.                            - Resume previous diet.                           - Continue present medications.                           - Office will schedule                           1) Upper GI series re: hiatal hernia and dysphagia                           2) esophageal manometry re: dysphagia                           - Change pantoprazole to bid before breakfast and                            supper  will call her with test results and plans - may                            need to consider repair of hernia Gatha Mayer, MD 02/17/2017 4:14:05 PM This report has been signed electronically.

## 2017-02-17 NOTE — Progress Notes (Signed)
Called to room to assist during endoscopic procedure.  Patient ID and intended procedure confirmed with present staff. Received instructions for my participation in the procedure from the performing physician.  

## 2017-02-17 NOTE — Consult Note (Signed)
  [facility] Post-op Note  Patient: Connie Dillon  Procedure(s) Performed: endoscopy with dilatation  Patient Location: [facility] - Recovery Area  Anesthesia Type: Deep Sedation/Propofol  Level of Consciousness: awake, oriented and patient cooperative  Airway and Oxygen Therapy: Patient Spontanous Breathing  Post-op Pain: none  Post-op Assessment:  Post-op Vital signs reviewed, Patient's Cardiovascular Status Stable, Respiratory Function Stable, Patent Airway, No signs of Nausea or vomiting and Pain level controlled  Post-op Vital Signs: Reviewed and stable  Complications: No apparent anesthesia complications  Chrysa Rampy E Avielle Imbert 4:09 PM

## 2017-02-20 ENCOUNTER — Telehealth: Payer: Self-pay

## 2017-02-20 ENCOUNTER — Telehealth: Payer: Self-pay | Admitting: *Deleted

## 2017-02-20 NOTE — Telephone Encounter (Signed)
Left message

## 2017-02-20 NOTE — Telephone Encounter (Signed)
  Follow up Call-  Call back number 02/17/2017  Post procedure Call Back phone  # [phone]  Permission to leave phone message Yes  Some recent data might be hidden     Patient questions:  Do you have a fever, pain , or abdominal swelling? No. Pain Score  0 *  Have you tolerated food without any problems? Yes.    Have you been able to return to your normal activities? Yes.    Do you have any questions about your discharge instructions: Diet   No. Medications  No. Follow up visit  No.  Do you have questions or concerns about your Care? No.  Actions: * If pain score is 4 or above: No action needed, pain <4.

## 2017-02-21 ENCOUNTER — Other Ambulatory Visit: Payer: Self-pay

## 2017-02-21 ENCOUNTER — Telehealth: Payer: Self-pay

## 2017-02-21 DIAGNOSIS — R1310 Dysphagia, unspecified: Secondary | ICD-10-CM

## 2017-02-21 DIAGNOSIS — K449 Diaphragmatic hernia without obstruction or gangrene: Secondary | ICD-10-CM

## 2017-02-21 NOTE — Addendum Note (Signed)
Addended by: Marlon Pel on: 02/21/2017 03:38 PM   Modules accepted: Orders

## 2017-02-21 NOTE — Telephone Encounter (Signed)
Patient notified of the manometry scheduled for 03/13/17 at [facility] and UGI on 03/07/17 10:30.  She is notified to NPO after midnight for UGI.  I will mail her instructions for the manometry

## 2017-02-28 ENCOUNTER — Ambulatory Visit
Admission: RE | Admit: 2017-02-28 | Discharge: 2017-02-28 | Disposition: A | Discharge status: Home / Self Care | Payer: PPO | Source: Ambulatory Visit | Attending: Internal Medicine | Admitting: Internal Medicine

## 2017-02-28 DIAGNOSIS — K449 Diaphragmatic hernia without obstruction or gangrene: Secondary | ICD-10-CM

## 2017-02-28 DIAGNOSIS — R1310 Dysphagia, unspecified: Secondary | ICD-10-CM

## 2017-03-01 ENCOUNTER — Encounter: Payer: Self-pay | Admitting: Internal Medicine

## 2017-03-01 NOTE — Progress Notes (Signed)
I left her a message that we had results and that we would call her or she could call back.  She has a paraesophageal hiatal hernia - stomach gets beside her esophagus and that is likely cause of swallowing problems and many of her other sxs  Surgical repair is best option for this and I would like to send her to a surgeon.  I can explain more via phone when able or if she wants to do a My Chart dialogue she can send [place] a message that way

## 2017-03-07 ENCOUNTER — Ambulatory Visit (HOSPITAL_COMMUNITY): Payer: PPO

## 2017-03-13 ENCOUNTER — Ambulatory Visit (HOSPITAL_COMMUNITY)
Admission: RE | Admit: 2017-03-13 | Discharge: 2017-03-13 | Disposition: A | Discharge status: Home / Self Care | Payer: PPO | Source: Ambulatory Visit | Attending: Gastroenterology | Admitting: Gastroenterology

## 2017-03-13 ENCOUNTER — Encounter (HOSPITAL_COMMUNITY)
Admission: RE | Disposition: A | Discharge status: Home / Self Care | Payer: Self-pay | Source: Ambulatory Visit | Attending: Gastroenterology

## 2017-03-13 DIAGNOSIS — K222 Esophageal obstruction: Secondary | ICD-10-CM | POA: Diagnosis not present

## 2017-03-13 DIAGNOSIS — R1310 Dysphagia, unspecified: Secondary | ICD-10-CM

## 2017-03-13 DIAGNOSIS — K219 Gastro-esophageal reflux disease without esophagitis: Secondary | ICD-10-CM | POA: Diagnosis not present

## 2017-03-13 DIAGNOSIS — K449 Diaphragmatic hernia without obstruction or gangrene: Secondary | ICD-10-CM

## 2017-03-13 HISTORY — PX: ESOPHAGEAL MANOMETRY: SHX5429

## 2017-03-13 SURGERY — MANOMETRY, ESOPHAGUS
Anesthesia: Topical

## 2017-03-13 MED ORDER — LIDOCAINE VISCOUS 2 % MT SOLN
OROMUCOSAL | Status: AC
  Filled 2017-03-13: qty 15

## 2017-03-13 SURGICAL SUPPLY — 2 items
FACESHIELD LNG OPTICON STERILE (SAFETY) IMPLANT
GLOVE BIO SURGEON STRL SZ8 (GLOVE) ×6 IMPLANT

## 2017-03-13 NOTE — Progress Notes (Signed)
Esophageal Manometry done per protocol. Pt tolerated well without complication.  Report to be read by Dr. Silverio Decamp.

## 2017-03-15 ENCOUNTER — Encounter (HOSPITAL_COMMUNITY): Payer: Self-pay | Admitting: Gastroenterology

## 2017-03-15 DIAGNOSIS — Z961 Presence of intraocular lens: Secondary | ICD-10-CM | POA: Diagnosis not present

## 2017-03-15 DIAGNOSIS — H524 Presbyopia: Secondary | ICD-10-CM | POA: Diagnosis not present

## 2017-03-15 DIAGNOSIS — H52223 Regular astigmatism, bilateral: Secondary | ICD-10-CM | POA: Diagnosis not present

## 2017-03-22 ENCOUNTER — Ambulatory Visit: Payer: Self-pay | Admitting: Internal Medicine

## 2017-03-22 DIAGNOSIS — K449 Diaphragmatic hernia without obstruction or gangrene: Secondary | ICD-10-CM | POA: Diagnosis not present

## 2017-03-22 DIAGNOSIS — S61012A Laceration without foreign body of left thumb without damage to nail, initial encounter: Secondary | ICD-10-CM | POA: Diagnosis not present

## 2017-03-23 DIAGNOSIS — K449 Diaphragmatic hernia without obstruction or gangrene: Secondary | ICD-10-CM

## 2017-03-23 DIAGNOSIS — R1310 Dysphagia, unspecified: Secondary | ICD-10-CM

## 2017-03-24 NOTE — Progress Notes (Signed)
My Chart note Re: overall good fx of the esophagus but hiatal hernia causing effects (slated for repair)

## 2017-03-28 DIAGNOSIS — F325 Major depressive disorder, single episode, in full remission: Secondary | ICD-10-CM | POA: Diagnosis not present

## 2017-03-28 DIAGNOSIS — R030 Elevated blood-pressure reading, without diagnosis of hypertension: Secondary | ICD-10-CM | POA: Diagnosis not present

## 2017-03-28 DIAGNOSIS — R7301 Impaired fasting glucose: Secondary | ICD-10-CM | POA: Diagnosis not present

## 2017-03-28 DIAGNOSIS — E7800 Pure hypercholesterolemia, unspecified: Secondary | ICD-10-CM | POA: Diagnosis not present

## 2017-03-28 DIAGNOSIS — R1314 Dysphagia, pharyngoesophageal phase: Secondary | ICD-10-CM | POA: Diagnosis not present

## 2017-03-28 DIAGNOSIS — J301 Allergic rhinitis due to pollen: Secondary | ICD-10-CM | POA: Diagnosis not present

## 2017-03-28 DIAGNOSIS — I700 Atherosclerosis of aorta: Secondary | ICD-10-CM | POA: Diagnosis not present

## 2017-03-28 DIAGNOSIS — K449 Diaphragmatic hernia without obstruction or gangrene: Secondary | ICD-10-CM | POA: Diagnosis not present

## 2017-04-11 ENCOUNTER — Ambulatory Visit: Payer: Self-pay | Admitting: Internal Medicine

## 2017-04-27 DIAGNOSIS — M5417 Radiculopathy, lumbosacral region: Secondary | ICD-10-CM | POA: Diagnosis not present

## 2017-05-01 NOTE — Patient Instructions (Signed)
Connie Dillon  05/01/2017   Your procedure is scheduled on: 05/12/2017    Report to [facility] Main  Entrance  Report to admitting at   0730am     Call this number if you have problems the morning of surgery [phone]   Remember: Do not eat food or drink liquids :After Midnight.     Take these medicines the morning of surgery with A SIP OF WATER: Cymbalta, Neurontin, Synthroid, Claritin if needed,Protonix                                 You may not have any metal on your body including hair pins and              piercings  Do not wear jewelry, make-up, lotions, powders or perfumes, deodorant             Do not wear nail polish.  Do not shave  48 hours prior to surgery.                 Do not bring valuables to the hospital. [place].  Contacts, dentures or bridgework may not be worn into surgery.  Leave suitcase in the car. After surgery it may be brought to your room.                   Please read over the following fact sheets you were given: _____________________________________________________________________             [facility] - Preparing for Surgery Before surgery, you can play an important role.  Because skin is not sterile, your skin needs to be as free of germs as possible.  You can reduce the number of germs on your skin by washing with CHG (chlorahexidine gluconate) soap before surgery.  CHG is an antiseptic cleaner which kills germs and bonds with the skin to continue killing germs even after washing. Please DO NOT use if you have an allergy to CHG or antibacterial soaps.  If your skin becomes reddened/irritated stop using the CHG and inform your nurse when you arrive at Short Stay. Do not shave (including legs and underarms) for at least 48 hours prior to the first CHG shower.  You may shave your face/neck. Please follow these instructions carefully:  1.  Shower with CHG Soap the  night before surgery and the  morning of Surgery.  2.  If you choose to wash your hair, wash your hair first as usual with your  normal  shampoo.  3.  After you shampoo, rinse your hair and body thoroughly to remove the  shampoo.                           4.  Use CHG as you would any other liquid soap.  You can apply chg directly  to the skin and wash                       Gently with a scrungie or clean washcloth.  5.  Apply the CHG Soap to your body ONLY FROM THE NECK DOWN.   Do not use on face/ open  Wound or open sores. Avoid contact with eyes, ears mouth and genitals (private parts).                       Wash face,  Genitals (private parts) with your normal soap.             6.  Wash thoroughly, paying special attention to the area where your surgery  will be performed.  7.  Thoroughly rinse your body with warm water from the neck down.  8.  DO NOT shower/wash with your normal soap after using and rinsing off  the CHG Soap.                9.  Pat yourself dry with a clean towel.            10.  Wear clean pajamas.            11.  Place clean sheets on your bed the night of your first shower and do not  sleep with pets. Day of Surgery : Do not apply any lotions/deodorants the morning of surgery.  Please wear clean clothes to the hospital/surgery center.  FAILURE TO FOLLOW THESE INSTRUCTIONS MAY RESULT IN THE CANCELLATION OF YOUR SURGERY PATIENT SIGNATURE_________________________________  NURSE SIGNATURE__________________________________  ________________________________________________________________________

## 2017-05-03 ENCOUNTER — Other Ambulatory Visit: Payer: Self-pay

## 2017-05-03 ENCOUNTER — Encounter (HOSPITAL_COMMUNITY)
Admission: RE | Admit: 2017-05-03 | Discharge: 2017-05-03 | Disposition: A | Discharge status: Home / Self Care | Payer: PPO | Source: Ambulatory Visit | Attending: Surgery | Admitting: Surgery

## 2017-05-03 ENCOUNTER — Encounter (HOSPITAL_COMMUNITY): Payer: Self-pay

## 2017-05-03 DIAGNOSIS — Z01812 Encounter for preprocedural laboratory examination: Secondary | ICD-10-CM | POA: Diagnosis not present

## 2017-05-03 HISTORY — DX: Personal history of urinary calculi: Z87.442

## 2017-05-03 HISTORY — DX: Pneumonia, unspecified organism: J18.9

## 2017-05-03 HISTORY — DX: Personal history of other diseases of the digestive system: Z87.19

## 2017-05-03 HISTORY — DX: Dyspnea, unspecified: R06.00

## 2017-05-03 LAB — CBC
HEMATOCRIT: 44.4 % (ref 36.0–46.0)
HEMOGLOBIN: 14.7 g/dL (ref 12.0–15.0)
MCH: 29.2 pg (ref 26.0–34.0)
MCHC: 33.1 g/dL (ref 30.0–36.0)
MCV: 88.1 fL (ref 78.0–100.0)
Platelets: 335 10*3/uL (ref 150–400)
RBC: 5.04 MIL/uL (ref 3.87–5.11)
RDW: 13 % (ref 11.5–15.5)
WBC: 6.4 10*3/uL (ref 4.0–10.5)

## 2017-05-03 LAB — COMPREHENSIVE METABOLIC PANEL
ALT: 36 U/L (ref 14–54)
AST: 29 U/L (ref 15–41)
Albumin: 3.9 g/dL (ref 3.5–5.0)
Alkaline Phosphatase: 87 U/L (ref 38–126)
Anion gap: 9 (ref 5–15)
BUN: 11 mg/dL (ref 6–20)
CO2: 24 mmol/L (ref 22–32)
Calcium: 8.8 mg/dL — ABNORMAL LOW (ref 8.9–10.3)
Chloride: 106 mmol/L (ref 101–111)
Creatinine, Ser: 0.78 mg/dL (ref 0.44–1.00)
Glucose, Bld: 127 mg/dL — ABNORMAL HIGH (ref 65–99)
POTASSIUM: 3.9 mmol/L (ref 3.5–5.1)
SODIUM: 139 mmol/L (ref 135–145)
Total Bilirubin: 0.7 mg/dL (ref 0.3–1.2)
Total Protein: 7 g/dL (ref 6.5–8.1)

## 2017-05-03 NOTE — Progress Notes (Signed)
CXR-01/2017-epic

## 2017-05-10 NOTE — H&P (Signed)
Chief Complaint: Large type III mixed type hernia  History of Present Illness:  Connie Dillon is an 62 y.o. female referred by Dr. Silvano Rusk with a moderately large type III mixed hiatal hernia.  She was certain seen in the [place] office in March.  I reviewed her upper GI series she appears to have about half of her stomach above the diaphragm.  She had manometry which was somewhat incomplete because her stomach was impinging upon her distal esophagus.  Her main symptoms include dysphagia with coughing.  She has no solid and liquid dysphagia and a globus sensation.  She mentioned that her mother has had a hiatal hernia at an older age and required surgery.  Informed consent was obtained regarding hernia repair with or without a Nissen fundoplication.  We plan to proceed with lap Nissen.  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  Past Medical History:  Diagnosis Date  . Adenomatous colon polyp   . Anxiety   . Aortic atherosclerosis ([place]) 2017   on CT scan  . Benign fundic gland polyps of stomach 05/06/2013  . Clostridium difficile infection 04/2015  . DDD (degenerative disc disease), lumbar   . Depression   . 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 Ocie Cornfield in 10/2010. [facility])  . Dyspnea    with exertion   . GERD (gastroesophageal reflux disease)   . Hepatic steatosis 2017   on CT scan  . History of hiatal hernia   . History of kidney stones   . History of SCC (squamous cell carcinoma) of skin   . Hyperlipidemia   . Hypothyroid   . IBS (irritable bowel syndrome)   . IFG (impaired fasting glucose)   . Narcotic abuse ([place])    distant history of muscle relaxant, sleeping pill  . Personal history of colonic polyp-Sessile serrated polyp 04/30/2013  . Pneumonia    hx of in 80s  . Seasonal allergic rhinitis   . Small fiber  polyneuropathy    symmetric, chronic pain    Past Surgical History:  Procedure Laterality Date  . CHOLECYSTECTOMY    . COLONOSCOPY W/ BIOPSIES    . ESOPHAGEAL MANOMETRY N/A 03/13/2017   Procedure: ESOPHAGEAL MANOMETRY (EM);  Surgeon: Mauri Pole, MD;  Location: [facility];  Service: Endoscopy;  Laterality: N/A;  . ESOPHAGOGASTRODUODENOSCOPY (EGD) WITH ESOPHAGEAL DILATION  02/2017  . LAPAROSCOPIC ENDOMETRIOSIS FULGURATION  1980's   patient denies at preop on 05/03/2017   . lipoma removal  2011  . Wide excision of dermatofibrosarcoma of right flank  10/2010   by a Dr. Ocie Cornfield in [facility].     Current Facility-Administered Medications  Medication Dose Route Frequency Provider Last Rate Last Dose  . 0.9 %  sodium chloride infusion  500 mL Intravenous Once Gatha Mayer, MD       Current Outpatient Medications  Medication Sig Dispense Refill  . chlorpheniramine (CHLOR-TRIMETON) 4 MG tablet Take 4 mg by mouth 2 (two) times daily as needed for allergies.    . DULoxetine (CYMBALTA) 60 MG capsule Take 60 mg by mouth daily.    Marland Kitchen gabapentin (NEURONTIN) 400 MG capsule Take 800 mg by mouth 3 (three) times daily.    Marland Kitchen ibuprofen (ADVIL,MOTRIN) 200 MG tablet Take 400 mg by mouth every 6 (six) hours as needed for headache or moderate pain.     Marland Kitchen levothyroxine (SYNTHROID,  LEVOTHROID) 50 MCG tablet Take 1 tablet (50 mcg total) by mouth daily. 30 tablet 0  . meloxicam (MOBIC) 15 MG tablet Take 15 mg by mouth daily as needed for pain.     . Tetrahydrozoline HCl (VISINE OP) Place 2 drops into both eyes daily as needed (for itching).    . pantoprazole (PROTONIX) 40 MG tablet Take 1 tablet (40 mg total) by mouth 2 (two) times daily before a meal. 20 mins pre-breakfast and supper (Patient not taking: Reported on 05/02/2017) 180 tablet 3   Cefdinir; Clindamycin/lincomycin; Doxycycline; Erythrocin [erythromycin]; Metformin and related; Other; Peanuts [peanut oil]; Statins; Zicam cold remedy [erysidoron #1];  Amoxicillin; Latex; Sudafed [pseudoephedrine hcl]; and Sulfa antibiotics Family History  Problem Relation Age of Onset  . Heart attack Father 31  . Stroke Father   . Hyperlipidemia Father   . Hypertension Father   . Coronary artery disease Father   . Breast cancer Paternal Aunt   . Colon cancer Paternal Aunt   . Hypothyroidism Mother   . Dementia Mother   . Alcoholism Mother   . Hyperlipidemia Sister    Social History:   reports that she quit smoking about 13 years ago. She has a 10.00 pack-year smoking history. She has never used smokeless tobacco. She reports that she does not drink alcohol or use drugs.   REVIEW OF SYSTEMS : Negative except for positive for her prior history of a dermatofibrosarcoma.  Physical Exam:   There were no vitals taken for this visit. There is no height or weight on file to calculate BMI.  Gen:  WDWN white female NAD  Neurological: Alert and oriented to person, place, and time. Motor and sensory function is grossly intact  Head: Normocephalic and atraumatic.  Eyes: Conjunctivae are normal. Pupils are equal, round, and reactive to light. No scleral icterus.  Neck: Normal range of motion. Neck supple. No tracheal deviation or thyromegaly present.  Cardiovascular:  SR without murmurs or gallops.  No carotid bruits Breast: Not examined Respiratory: Effort normal.  No respiratory distress. No chest wall tenderness. Breath sounds normal.  No wheezes, rales or rhonchi.  Abdomen: Scar in right lower quadrant from removal of dermatofibrosarcoma 2 g GU: Unremarkable Musculoskeletal: Normal range of motion. Extremities are nontender. No cyanosis, edema or clubbing noted Lymphadenopathy: No cervical, preauricular, postauricular or axillary adenopathy is present Skin: Skin is warm and dry. No rash noted. No diaphoresis. No erythema. No pallor. Pscyh: Normal mood and affect. Behavior is normal. Judgment and thought content normal.   LABORATORY RESULTS: No  results found for this or any previous visit (from the past 48 hour(s)).   RADIOLOGY RESULTS: No results found.  Problem List: Patient Active Problem List   Diagnosis Date Noted  . Dysphagia   . Hiatal hernia   . Exertional dyspnea 05/22/2014  . Chest pain 05/22/2014  . Carotid artery plaque 05/22/2014  . History of syncope 05/22/2014  . Peripheral neuropathy, idiopathic 10/07/2013  . Benign fundic gland polyps of stomach 05/06/2013  . Personal history of colonic polyp-Sessile serrated polyp 04/30/2013  . RUQ pain 04/23/2013  . RLQ abdominal pain 04/23/2013  . Diarrhea 04/11/2013  . Need for prophylactic vaccination and inoculation against influenza 10/10/2012  . Mediastinal mass 06/22/2011  . Hypothyroidism 05/16/2011  . History of sarcoma of soft tissue 05/16/2011  . Dermatofibrosarcoma protubera of flank 07/11/2010    Assessment & Plan: Large type III mixed hiatal hernia.  Plan laparoscopic reduction repair of the hiatal hernia with possible Nissen  fundoplication.  Informed consent is been obtained in the office and will proceed with surgery as scheduled.    Matt B. Hassell Done, MD, [facility] [phone] beeper [phone]  05/10/2017 8:17 PM

## 2017-05-12 ENCOUNTER — Other Ambulatory Visit: Payer: Self-pay

## 2017-05-12 ENCOUNTER — Inpatient Hospital Stay (HOSPITAL_COMMUNITY): Payer: PPO | Admitting: Certified Registered Nurse Anesthetist

## 2017-05-12 ENCOUNTER — Encounter (HOSPITAL_COMMUNITY)
Admission: RE | Disposition: A | Discharge status: Home / Self Care | Payer: Self-pay | Source: Ambulatory Visit | Attending: Surgery

## 2017-05-12 ENCOUNTER — Encounter (HOSPITAL_COMMUNITY): Payer: Self-pay | Admitting: Certified Registered Nurse Anesthetist

## 2017-05-12 ENCOUNTER — Inpatient Hospital Stay (HOSPITAL_COMMUNITY)
Admission: RE | Admit: 2017-05-12 | Discharge: 2017-05-14 | DRG: 328 | Disposition: A | Discharge status: Home / Self Care | Payer: PPO | Source: Ambulatory Visit | Attending: Surgery | Admitting: Surgery

## 2017-05-12 DIAGNOSIS — Z87891 Personal history of nicotine dependence: Secondary | ICD-10-CM | POA: Diagnosis not present

## 2017-05-12 DIAGNOSIS — K589 Irritable bowel syndrome without diarrhea: Secondary | ICD-10-CM | POA: Diagnosis present

## 2017-05-12 DIAGNOSIS — E039 Hypothyroidism, unspecified: Secondary | ICD-10-CM | POA: Diagnosis present

## 2017-05-12 DIAGNOSIS — Z79890 Hormone replacement therapy (postmenopausal): Secondary | ICD-10-CM

## 2017-05-12 DIAGNOSIS — Z9889 Other specified postprocedural states: Secondary | ICD-10-CM

## 2017-05-12 DIAGNOSIS — K449 Diaphragmatic hernia without obstruction or gangrene: Principal | ICD-10-CM | POA: Diagnosis present

## 2017-05-12 DIAGNOSIS — E785 Hyperlipidemia, unspecified: Secondary | ICD-10-CM | POA: Diagnosis present

## 2017-05-12 DIAGNOSIS — I898 Other specified noninfective disorders of lymphatic vessels and lymph nodes: Secondary | ICD-10-CM | POA: Diagnosis not present

## 2017-05-12 DIAGNOSIS — R1310 Dysphagia, unspecified: Secondary | ICD-10-CM | POA: Diagnosis not present

## 2017-05-12 DIAGNOSIS — K219 Gastro-esophageal reflux disease without esophagitis: Secondary | ICD-10-CM | POA: Diagnosis present

## 2017-05-12 DIAGNOSIS — Z85828 Personal history of other malignant neoplasm of skin: Secondary | ICD-10-CM | POA: Diagnosis not present

## 2017-05-12 HISTORY — PX: LAPAROSCOPIC NISSEN FUNDOPLICATION: SHX1932

## 2017-05-12 LAB — CBC
HCT: 39.5 % (ref 36.0–46.0)
Hemoglobin: 12.6 g/dL (ref 12.0–15.0)
MCH: 28.4 pg (ref 26.0–34.0)
MCHC: 31.9 g/dL (ref 30.0–36.0)
MCV: 89.2 fL (ref 78.0–100.0)
PLATELETS: 271 10*3/uL (ref 150–400)
RBC: 4.43 MIL/uL (ref 3.87–5.11)
RDW: 13 % (ref 11.5–15.5)
WBC: 14.1 10*3/uL — ABNORMAL HIGH (ref 4.0–10.5)

## 2017-05-12 LAB — CREATININE, SERUM
CREATININE: 0.71 mg/dL (ref 0.44–1.00)
GFR calc Af Amer: >60 mL/min (ref >60)

## 2017-05-12 SURGERY — REPAIR, HERNIA, PARAESOPHAGEAL, LAPAROSCOPIC
Anesthesia: General | Site: Abdomen

## 2017-05-12 MED ORDER — KCL IN DEXTROSE-NACL 20-5-0.45 MEQ/L-%-% IV SOLN
INTRAVENOUS | Status: DC
  Administered 2017-05-12: 15:00:00 via INTRAVENOUS
  Administered 2017-05-13: 100 mL/h via INTRAVENOUS
  Administered 2017-05-13: 18:00:00 via INTRAVENOUS
  Filled 2017-05-12 (×3): qty 1000

## 2017-05-12 MED ORDER — HEPARIN SODIUM (PORCINE) 5000 UNIT/ML IJ SOLN
5000.0000 [IU] | Freq: Three times a day (TID) | INTRAMUSCULAR | Status: DC
  Administered 2017-05-12 – 2017-05-13 (×3): 5000 [IU] via SUBCUTANEOUS
  Filled 2017-05-12 (×3): qty 1

## 2017-05-12 MED ORDER — PHENYLEPHRINE HCL 10 MG/ML IJ SOLN
INTRAMUSCULAR | Status: AC
  Filled 2017-05-12: qty 1

## 2017-05-12 MED ORDER — FENTANYL CITRATE (PF) 100 MCG/2ML IJ SOLN
INTRAMUSCULAR | Status: AC
  Filled 2017-05-12: qty 2

## 2017-05-12 MED ORDER — HEPARIN SODIUM (PORCINE) 5000 UNIT/ML IJ SOLN
5000.0000 [IU] | Freq: Once | INTRAMUSCULAR | Status: AC
  Administered 2017-05-12: 5000 [IU] via SUBCUTANEOUS
  Filled 2017-05-12: qty 1

## 2017-05-12 MED ORDER — SODIUM CHLORIDE 0.9 % IJ SOLN
INTRAMUSCULAR | Status: DC | PRN
  Administered 2017-05-12: 10 mL

## 2017-05-12 MED ORDER — DEXAMETHASONE SODIUM PHOSPHATE 4 MG/ML IJ SOLN
INTRAMUSCULAR | Status: DC | PRN
  Administered 2017-05-12: 5 mg via INTRAVENOUS

## 2017-05-12 MED ORDER — MIDAZOLAM HCL 5 MG/5ML IJ SOLN
INTRAMUSCULAR | Status: DC | PRN
  Administered 2017-05-12: 1 mg via INTRAVENOUS

## 2017-05-12 MED ORDER — FAMOTIDINE IN NACL 20-0.9 MG/50ML-% IV SOLN
20.0000 mg | Freq: Two times a day (BID) | INTRAVENOUS | Status: DC
  Administered 2017-05-12 – 2017-05-13 (×3): 20 mg via INTRAVENOUS
  Filled 2017-05-12 (×4): qty 50

## 2017-05-12 MED ORDER — FENTANYL CITRATE (PF) 100 MCG/2ML IJ SOLN: 25.0000 ug | INTRAMUSCULAR | Status: DC | PRN

## 2017-05-12 MED ORDER — ONDANSETRON HCL 4 MG/2ML IJ SOLN
INTRAMUSCULAR | Status: DC | PRN
  Administered 2017-05-12: 4 mg via INTRAVENOUS

## 2017-05-12 MED ORDER — CIPROFLOXACIN IN D5W 400 MG/200ML IV SOLN
400.0000 mg | Freq: Two times a day (BID) | INTRAVENOUS | Status: AC
  Administered 2017-05-12: 400 mg via INTRAVENOUS
  Filled 2017-05-12: qty 200

## 2017-05-12 MED ORDER — SUGAMMADEX SODIUM 200 MG/2ML IV SOLN
INTRAVENOUS | Status: DC | PRN
  Administered 2017-05-12: 200 mg via INTRAVENOUS

## 2017-05-12 MED ORDER — MIDAZOLAM HCL 2 MG/2ML IJ SOLN
INTRAMUSCULAR | Status: AC
  Filled 2017-05-12: qty 2

## 2017-05-12 MED ORDER — DEXAMETHASONE SODIUM PHOSPHATE 10 MG/ML IJ SOLN
INTRAMUSCULAR | Status: AC
  Filled 2017-05-12: qty 1

## 2017-05-12 MED ORDER — PROMETHAZINE HCL 25 MG/ML IJ SOLN: 12.5000 mg | Freq: Four times a day (QID) | INTRAMUSCULAR | Status: DC | PRN

## 2017-05-12 MED ORDER — FENTANYL CITRATE (PF) 250 MCG/5ML IJ SOLN
INTRAMUSCULAR | Status: AC
  Filled 2017-05-12: qty 5

## 2017-05-12 MED ORDER — LACTATED RINGERS IV SOLN: INTRAVENOUS | Status: DC

## 2017-05-12 MED ORDER — ONDANSETRON HCL 4 MG/2ML IJ SOLN: 4.0000 mg | Freq: Four times a day (QID) | INTRAMUSCULAR | Status: DC | PRN

## 2017-05-12 MED ORDER — 0.9 % SODIUM CHLORIDE (POUR BTL) OPTIME
TOPICAL | Status: DC | PRN
  Administered 2017-05-12: 1000 mL

## 2017-05-12 MED ORDER — SUGAMMADEX SODIUM 200 MG/2ML IV SOLN
INTRAVENOUS | Status: AC
  Filled 2017-05-12: qty 2

## 2017-05-12 MED ORDER — FENTANYL CITRATE (PF) 100 MCG/2ML IJ SOLN
12.5000 ug | INTRAMUSCULAR | Status: DC | PRN
  Administered 2017-05-12 – 2017-05-13 (×6): 12.5 ug via INTRAVENOUS
  Filled 2017-05-12 (×7): qty 2

## 2017-05-12 MED ORDER — GABAPENTIN 300 MG PO CAPS: 300.0000 mg | ORAL_CAPSULE | ORAL | Status: DC

## 2017-05-12 MED ORDER — PHENYLEPHRINE HCL 10 MG/ML IJ SOLN
INTRAMUSCULAR | Status: DC | PRN
  Administered 2017-05-12: 20 ug/min via INTRAVENOUS

## 2017-05-12 MED ORDER — EPHEDRINE SULFATE-NACL 50-0.9 MG/10ML-% IV SOSY
PREFILLED_SYRINGE | INTRAVENOUS | Status: DC | PRN
  Administered 2017-05-12: 10 mg via INTRAVENOUS

## 2017-05-12 MED ORDER — GLYCOPYRROLATE 0.2 MG/ML IV SOSY
PREFILLED_SYRINGE | INTRAVENOUS | Status: AC
  Filled 2017-05-12: qty 5

## 2017-05-12 MED ORDER — LIDOCAINE 2% (20 MG/ML) 5 ML SYRINGE
INTRAMUSCULAR | Status: DC | PRN
  Administered 2017-05-12: 60 mg via INTRAVENOUS

## 2017-05-12 MED ORDER — LIDOCAINE 2% (20 MG/ML) 5 ML SYRINGE
INTRAMUSCULAR | Status: AC
  Filled 2017-05-12: qty 5

## 2017-05-12 MED ORDER — PHENYLEPHRINE 40 MCG/ML (10ML) SYRINGE FOR IV PUSH (FOR BLOOD PRESSURE SUPPORT)
PREFILLED_SYRINGE | INTRAVENOUS | Status: DC | PRN
  Administered 2017-05-12 (×6): 80 ug via INTRAVENOUS

## 2017-05-12 MED ORDER — ONDANSETRON HCL 4 MG/2ML IJ SOLN
INTRAMUSCULAR | Status: AC
  Filled 2017-05-12: qty 2

## 2017-05-12 MED ORDER — PHENYLEPHRINE 40 MCG/ML (10ML) SYRINGE FOR IV PUSH (FOR BLOOD PRESSURE SUPPORT)
PREFILLED_SYRINGE | INTRAVENOUS | Status: AC
  Filled 2017-05-12: qty 10

## 2017-05-12 MED ORDER — HYDRALAZINE HCL 20 MG/ML IJ SOLN: 10.0000 mg | INTRAMUSCULAR | Status: DC | PRN

## 2017-05-12 MED ORDER — METOCLOPRAMIDE HCL 5 MG/ML IJ SOLN: 10.0000 mg | Freq: Once | INTRAMUSCULAR | Status: DC | PRN

## 2017-05-12 MED ORDER — FENTANYL CITRATE (PF) 100 MCG/2ML IJ SOLN
INTRAMUSCULAR | Status: DC | PRN
  Administered 2017-05-12 (×3): 50 ug via INTRAVENOUS
  Administered 2017-05-12: 100 ug via INTRAVENOUS

## 2017-05-12 MED ORDER — BUPIVACAINE LIPOSOME 1.3 % IJ SUSP
20.0000 mL | Freq: Once | INTRAMUSCULAR | Status: AC
  Administered 2017-05-12: 20 mL
  Filled 2017-05-12: qty 20

## 2017-05-12 MED ORDER — LIDOCAINE 2% (20 MG/ML) 5 ML SYRINGE
INTRAMUSCULAR | Status: DC | PRN
  Administered 2017-05-12: 1.5 mg/kg/h via INTRAVENOUS

## 2017-05-12 MED ORDER — SUCCINYLCHOLINE CHLORIDE 200 MG/10ML IV SOSY
PREFILLED_SYRINGE | INTRAVENOUS | Status: DC | PRN
  Administered 2017-05-12: 100 mg via INTRAVENOUS

## 2017-05-12 MED ORDER — MEPERIDINE HCL 50 MG/ML IJ SOLN: 6.2500 mg | INTRAMUSCULAR | Status: DC | PRN

## 2017-05-12 MED ORDER — LACTATED RINGERS IV SOLN
INTRAVENOUS | Status: DC
  Administered 2017-05-12 (×2): via INTRAVENOUS

## 2017-05-12 MED ORDER — ONDANSETRON 4 MG PO TBDP: 4.0000 mg | ORAL_TABLET | Freq: Four times a day (QID) | ORAL | Status: DC | PRN

## 2017-05-12 MED ORDER — LIDOCAINE HCL 2 % IJ SOLN
INTRAMUSCULAR | Status: AC
  Filled 2017-05-12: qty 20

## 2017-05-12 MED ORDER — ACETAMINOPHEN 500 MG PO TABS
1000.0000 mg | ORAL_TABLET | ORAL | Status: AC
  Administered 2017-05-12: 1000 mg via ORAL
  Filled 2017-05-12: qty 2

## 2017-05-12 MED ORDER — CHLORHEXIDINE GLUCONATE CLOTH 2 % EX PADS: 6.0000 | MEDICATED_PAD | Freq: Once | CUTANEOUS | Status: DC

## 2017-05-12 MED ORDER — EPHEDRINE 5 MG/ML INJ
INTRAVENOUS | Status: AC
  Filled 2017-05-12: qty 10

## 2017-05-12 MED ORDER — ROCURONIUM BROMIDE 10 MG/ML (PF) SYRINGE
PREFILLED_SYRINGE | INTRAVENOUS | Status: DC | PRN
  Administered 2017-05-12: 20 mg via INTRAVENOUS
  Administered 2017-05-12: 50 mg via INTRAVENOUS

## 2017-05-12 MED ORDER — LACTATED RINGERS IR SOLN
Status: DC | PRN
  Administered 2017-05-12: 1000 mL

## 2017-05-12 MED ORDER — CIPROFLOXACIN IN D5W 400 MG/200ML IV SOLN
400.0000 mg | INTRAVENOUS | Status: AC
  Administered 2017-05-12: 400 mg via INTRAVENOUS
  Filled 2017-05-12: qty 200

## 2017-05-12 MED ORDER — PROPOFOL 10 MG/ML IV BOLUS
INTRAVENOUS | Status: DC | PRN
  Administered 2017-05-12: 140 mg via INTRAVENOUS

## 2017-05-12 MED ORDER — SODIUM CHLORIDE 0.9 % IJ SOLN
INTRAMUSCULAR | Status: AC
  Filled 2017-05-12: qty 10

## 2017-05-12 SURGICAL SUPPLY — 51 items
APPLIER CLIP ROT 10 11.4 M/L (STAPLE)
BENZOIN TINCTURE PRP APPL 2/3 (GAUZE/BANDAGES/DRESSINGS) IMPLANT
CABLE HIGH FREQUENCY MONO STRZ (ELECTRODE) IMPLANT
CLIP APPLIE ROT 10 11.4 M/L (STAPLE) IMPLANT
CLOSURE WOUND 1/2 X4 (GAUZE/BANDAGES/DRESSINGS)
COVER SURGICAL LIGHT HANDLE (MISCELLANEOUS) ×3 IMPLANT
DERMABOND ADVANCED (GAUZE/BANDAGES/DRESSINGS) ×2
DERMABOND ADVANCED .7 DNX12 (GAUZE/BANDAGES/DRESSINGS) ×1 IMPLANT
DEVICE SUT QUICK LOAD TK 5 (STAPLE) ×12 IMPLANT
DEVICE SUT TI-KNOT TK 5X26 (MISCELLANEOUS) ×2 IMPLANT
DEVICE SUTURE ENDOST 10MM (ENDOMECHANICALS) ×3 IMPLANT
DEVICE TI KNOT TK5 (MISCELLANEOUS) ×1
DISSECTOR BLUNT TIP ENDO 5MM (MISCELLANEOUS) ×3 IMPLANT
DRAIN PENROSE .75X.25X12 SILI (WOUND CARE) ×3 IMPLANT
DRAIN PENROSE 18X1/2 LTX STRL (DRAIN) ×3 IMPLANT
ELECT PENCIL ROCKER SW 15FT (MISCELLANEOUS) IMPLANT
ELECT REM PT RETURN 15FT ADLT (MISCELLANEOUS) ×3 IMPLANT
FELT TEFLON 4 X1 (Mesh General) ×3 IMPLANT
GLOVE BIOGEL M 8.0 STRL (GLOVE) ×3 IMPLANT
GOWN STRL REUS W/TWL XL LVL3 (GOWN DISPOSABLE) ×12 IMPLANT
GRASPER ENDO BABCOCK 10 (MISCELLANEOUS) ×1 IMPLANT
GRASPER ENDO BABCOCK 10MM (MISCELLANEOUS) ×2
GRASPER ENDOPATH ANVIL 10MM (MISCELLANEOUS) IMPLANT
KIT BASIN OR (CUSTOM PROCEDURE TRAY) ×3 IMPLANT
NEEDLE SPNL 22GX3.5 QUINCKE BK (NEEDLE) ×3 IMPLANT
PAD POSITIONING PINK XL (MISCELLANEOUS) IMPLANT
POSITIONER SURGICAL ARM (MISCELLANEOUS) IMPLANT
QUICK LOAD TK 5 (STAPLE) ×6
SCISSORS LAP 5X45 EPIX DISP (ENDOMECHANICALS) ×3 IMPLANT
SCRUB TECHNI CARE 4 OZ NO DYE (MISCELLANEOUS) ×3 IMPLANT
SET IRRIG TUBING LAPAROSCOPIC (IRRIGATION / IRRIGATOR) ×3 IMPLANT
SHEARS HARMONIC ACE PLUS 45CM (MISCELLANEOUS) ×3 IMPLANT
SLEEVE ADV FIXATION 5X100MM (TROCAR) IMPLANT
SOLUTION ANTI FOG 6CC (MISCELLANEOUS) ×3 IMPLANT
STAPLER VISISTAT 35W (STAPLE) ×3 IMPLANT
STRIP CLOSURE SKIN 1/2X4 (GAUZE/BANDAGES/DRESSINGS) IMPLANT
SUT MNCRL AB 4-0 PS2 18 (SUTURE) ×6 IMPLANT
SUT SURGIDAC NAB ES-9 0 48 120 (SUTURE) ×24 IMPLANT
SUT VIC AB 4-0 SH 18 (SUTURE) ×3 IMPLANT
TAPE CLOTH 4X10 WHT NS (GAUZE/BANDAGES/DRESSINGS) IMPLANT
TIP INNERVISION DETACH 40FR (MISCELLANEOUS) IMPLANT
TIP INNERVISION DETACH 50FR (MISCELLANEOUS) IMPLANT
TIP INNERVISION DETACH 56FR (MISCELLANEOUS) ×3 IMPLANT
TIPS INNERVISION DETACH 40FR (MISCELLANEOUS)
TOWEL OR 17X26 10 PK STRL BLUE (TOWEL DISPOSABLE) ×6 IMPLANT
TRAY FOL W/BAG SLVR 16FR STRL (SET/KITS/TRAYS/PACK) ×1 IMPLANT
TRAY FOLEY W/BAG SLVR 16FR LF (SET/KITS/TRAYS/PACK) ×2
TROCAR ADV FIXATION 11X100MM (TROCAR) IMPLANT
TROCAR ADV FIXATION 5X100MM (TROCAR) ×3 IMPLANT
TROCAR BLADELESS OPT 5 100 (ENDOMECHANICALS) ×3 IMPLANT
TUBING INSUF HEATED (TUBING) ×3 IMPLANT

## 2017-05-12 NOTE — Anesthesia Postprocedure Evaluation (Signed)
Anesthesia Post Note  Patient: Tamiki Kuba  Procedure(s) Performed: LAPAROSCOPIC TAKEWDOWN LARGE TYPE III MIXED HIATAL HERNIA (N/A Abdomen) LAPAROSCOPIC NISSEN FUNDOPLICATION (N/A Abdomen)     Patient location during evaluation: PACU Anesthesia Type: General Level of consciousness: awake and alert Pain management: pain level controlled Vital Signs Assessment: post-procedure vital signs reviewed and stable Respiratory status: spontaneous breathing, nonlabored ventilation, respiratory function stable and patient connected to nasal cannula oxygen Cardiovascular status: blood pressure returned to baseline and stable Postop Assessment: no apparent nausea or vomiting Anesthetic complications: no    Last Vitals:  Vitals:   05/12/17 1245 05/12/17 1300  BP: 113/76 (!) 107/51  Pulse: (!) 103 (!) 104  Resp: 14 16  Temp:    SpO2: 96% 92%    Last Pain:  Vitals:   05/12/17 1330  TempSrc:   PainSc: 0-No pain                 Montez Hageman

## 2017-05-12 NOTE — Anesthesia Preprocedure Evaluation (Signed)
Anesthesia Evaluation  Patient identified by MRN, date of birth, ID band Patient awake    Reviewed: Allergy & Precautions, NPO status , Patient's Chart, lab work & pertinent test results  Airway Mallampati: II  TM Distance: >3 FB Neck ROM: Full    Dental no notable dental hx.    Pulmonary neg pulmonary ROS, former smoker,    Pulmonary exam normal breath sounds clear to auscultation       Cardiovascular negative cardio ROS Normal cardiovascular exam Rhythm:Regular Rate:Normal     Neuro/Psych Anxiety Depression negative neurological ROS  negative psych ROS   GI/Hepatic Neg liver ROS, hiatal hernia,   Endo/Other  Hypothyroidism   Renal/GU negative Renal ROS  negative genitourinary   Musculoskeletal negative musculoskeletal ROS (+)   Abdominal   Peds negative pediatric ROS (+)  Hematology negative hematology ROS (+)   Anesthesia Other Findings   Reproductive/Obstetrics negative OB ROS                             Anesthesia Physical Anesthesia Plan  ASA: II  Anesthesia Plan: General   Post-op Pain Management:    Induction: Intravenous  PONV Risk Score and Plan: 4 or greater and Ondansetron, Dexamethasone, Midazolam, Scopolamine patch - Pre-op and Treatment may vary due to age or medical condition  Airway Management Planned: Oral ETT  Additional Equipment:   Intra-op Plan:   Post-operative Plan: Extubation in OR  Informed Consent: I have reviewed the patients History and Physical, chart, labs and discussed the procedure including the risks, benefits and alternatives for the proposed anesthesia with the patient or authorized representative who has indicated his/her understanding and acceptance.   Dental advisory given  Plan Discussed with: CRNA  Anesthesia Plan Comments:         Anesthesia Quick Evaluation

## 2017-05-12 NOTE — Op Note (Signed)
Connie Dillon  July 01, 1955 May 12, 2017   PCP:  [name], MD   Surgeon: Kaylyn Lim, MD, FACS  Asst:  Romana Juniper, MD  Anes:  general  Preop Dx: Large type III hiatal hernia Postop Dx: same  Procedure: Laparoscopic takedown and repair of hiatal hernia with closure of hiatus posteriorly; Nissen fundoplication over a 56 #lighted dilator Location Surgery: [facility] OR 1  Op time 2 hours Complications: None noted  EBL:   minimal cc  Drains: none  Description of Procedure:  The patient was taken to OR 1 .  After anesthesia was administered and the patient was prepped a timeout was performed.  Access achieved through the left upper quadrant without difficulty.  Adhesions in the upper midline into the left and right of midline from her lap chole were taken down sharply.   Nathanson retractor was placed to the upper midline through an old incision was reopened.  With that secured we could see the defect.  My standard 6 port trocar placement was used with 2 in the left upper quadrant for my assistant a camera port slightly to the left of the midline and then a 12 to the right of midline and then a 5 laterally.  The stomach was first grasped by my assistant and I took down the right crus attachments and the sac carried this anteriorly and then a crossover on the left crus.  Short gastrics were taken down and then I went up and was able to get the sac down from the mediastinum.  Penrose drain was then placed for traction and I was able to free the esophagus from the lateral attachments it would might tend to want to pull it up into the chest.  We had at least 3 cm of the esophagus and the abdomen.  We had good visualization of the posterior crura.  About 5 sutures were placed posteriorly with individual purchases of right and left crus with one with a tie not using a pledget more towards the posterior aspect and the remaining portions were done with the [place] and more hand-tied with a tying  [place].  A nice closure was present.  The stomach was taken down initially we were sizing a wrap around the bougie and we remove the pulled it back a bit and took down some more of the posterior attachments to the upper fundus and then were able to get a nice wrap of stomach around over the 56 lighted bougie.  There was a tiny node that I encountered while I was taking out the sac that I sent separately although it did not look pathologic.  With a 56 lighted bougie in place we placed 3 sutures through the stomach is soft periesophageal tissue in the wrap stomach securing the top one with a tie knot in the lower 2 were tied with intracorporeal ties..  All suturing was done with the Endo Stitch.  The lighted bougie was removed without difficulty.  Tap bite block was then performed laterally on both sides with 30 cc of Exparel which been diluted with 10 cc of saline.  Wounds were closed with 4-0 Monocryl and Dermabond.  Patient tolerated procedure well was taken to the recovery room in satisfactory condition.   Matt B. Hassell Done, [place], [facility], [place]

## 2017-05-12 NOTE — Interval H&P Note (Signed)
History and Physical Interval Note:  05/12/2017 8:46 AM  Connie Dillon  has presented today for surgery, with the diagnosis of HIATAL HERNIA  The various methods of treatment have been discussed with the patient and family. After consideration of risks, benefits and other options for treatment, the patient has consented to  Procedure(s): [place] (N/A) as a surgical intervention .  The patient's history has been reviewed, patient examined, no change in status, stable for surgery.  I have reviewed the patient's chart and labs.  Questions were answered to the patient's satisfaction.     Pedro Earls

## 2017-05-12 NOTE — Transfer of Care (Signed)
Immediate Anesthesia Transfer of Care Note  Patient: Connie Dillon  Procedure(s) Performed: LAPAROSCOPIC TAKEWDOWN LARGE TYPE III MIXED HIATAL HERNIA (N/A Abdomen) LAPAROSCOPIC NISSEN FUNDOPLICATION (N/A Abdomen)  Patient Location: PACU  Anesthesia Type:General  Level of Consciousness: drowsy  Airway & Oxygen Therapy: Patient Spontanous Breathing and Patient connected to face mask  Post-op Assessment: Report given to RN and Post -op Vital signs reviewed and stable  Post vital signs: Reviewed and stable  Last Vitals:  Vitals Value Taken Time  BP    Temp    Pulse 103 05/12/2017 12:38 PM  Resp    SpO2 91 % 05/12/2017 12:38 PM  Vitals shown include unvalidated device data.  Last Pain:  Vitals:   05/12/17 0804  TempSrc:   PainSc: 4       Patients Stated Pain Goal: 3 (97/28/20 6015)  Complications: No apparent anesthesia complications

## 2017-05-12 NOTE — Anesthesia Procedure Notes (Signed)
Procedure Name: Intubation Date/Time: 05/12/2017 9:46 AM Performed by: Claudia Desanctis, CRNA Pre-anesthesia Checklist: Patient identified, Emergency Drugs available, Suction available and Patient being monitored Patient Re-evaluated:Patient Re-evaluated prior to induction Oxygen Delivery Method: Circle system utilized Preoxygenation: Pre-oxygenation with 100% oxygen Induction Type: IV induction Ventilation: Mask ventilation without difficulty Laryngoscope Size: 2 and Miller Grade View: Grade I Tube type: Oral Tube size: 7.5 mm Number of attempts: 1 Airway Equipment and Method: Stylet Placement Confirmation: ETT inserted through vocal cords under direct vision,  positive ETCO2 and breath sounds checked- equal and bilateral Secured at: 22 cm Tube secured with: Tape Dental Injury: Teeth and Oropharynx as per pre-operative assessment

## 2017-05-13 ENCOUNTER — Inpatient Hospital Stay (HOSPITAL_COMMUNITY): Payer: PPO

## 2017-05-13 LAB — BASIC METABOLIC PANEL
Anion gap: 10 (ref 5–15)
BUN: 9 mg/dL (ref 6–20)
CALCIUM: 9.2 mg/dL (ref 8.9–10.3)
CHLORIDE: 106 mmol/L (ref 101–111)
CO2: 24 mmol/L (ref 22–32)
CREATININE: 0.74 mg/dL (ref 0.44–1.00)
GFR calc Af Amer: >60 mL/min (ref >60)
GFR calc non Af Amer: >60 mL/min (ref >60)
Glucose, Bld: 137 mg/dL — ABNORMAL HIGH (ref 65–99)
Potassium: 4.7 mmol/L (ref 3.5–5.1)
SODIUM: 140 mmol/L (ref 135–145)

## 2017-05-13 LAB — CBC
HEMATOCRIT: 38.2 % (ref 36.0–46.0)
HEMOGLOBIN: 12.5 g/dL (ref 12.0–15.0)
MCH: 29 pg (ref 26.0–34.0)
MCHC: 32.7 g/dL (ref 30.0–36.0)
MCV: 88.6 fL (ref 78.0–100.0)
Platelets: 265 10*3/uL (ref 150–400)
RBC: 4.31 MIL/uL (ref 3.87–5.11)
RDW: 12.9 % (ref 11.5–15.5)
WBC: 10.7 10*3/uL — ABNORMAL HIGH (ref 4.0–10.5)

## 2017-05-13 MED ORDER — IOPAMIDOL (ISOVUE-300) INJECTION 61%
INTRAVENOUS | Status: AC
  Filled 2017-05-13: qty 50

## 2017-05-13 NOTE — Progress Notes (Signed)
1 Day Post-Op lap HH repair Subjective: Pain controlled, no nausea, awaiting swallow study  Objective: Vital signs in last 24 hours: Temp:  [97.6 F (36.4 C)-98.5 F (36.9 C)] 98.1 F (36.7 C) (05/04 0507) Pulse Rate:  [68-104] 71 (05/04 0507) Resp:  [12-16] 13 (05/04 0507) BP: (95-129)/(42-98) 97/45 (05/04 0507) SpO2:  [90 %-97 %] 97 % (05/04 0507)   Intake/Output from previous day: 05/03 0701 - 05/04 0700 In: 3805.7 [I.V.:3555.7; IV Piggyback:250] Out: 2720 [Urine:2700; Blood:20] Intake/Output this shift: No intake/output data recorded.   General appearance: alert and cooperative GI: normal findings: soft, non-tender  Incision: no significant drainage  Lab Results:  Recent Labs    05/12/17 1419 05/13/17 0449  WBC 14.1* 10.7*  HGB 12.6 12.5  HCT 39.5 38.2  PLT 271 265   BMET Recent Labs    05/12/17 1419 05/13/17 0449  NA  --  140  K  --  4.7  CL  --  106  CO2  --  24  GLUCOSE  --  137*  BUN  --  9  CREATININE 0.71 0.74  CALCIUM  --  9.2   PT/INR No results for input(s): LABPROT, INR in the last 72 hours. ABG No results for input(s): PHART, HCO3 in the last 72 hours.  Invalid input(s): PCO2, PO2  MEDS, Scheduled . heparin injection (subcutaneous)  5,000 Units Subcutaneous Q8H    Studies/Results: No results found.  Assessment: s/p Procedure(s): LAPAROSCOPIC TAKEWDOWN LARGE TYPE III MIXED HIATAL HERNIA LAPAROSCOPIC NISSEN FUNDOPLICATION Patient Active Problem List   Diagnosis Date Noted  . Status post Nissen fundoplication May 3500 [id number]/81/8299  . Dysphagia   . Hiatal hernia   . Exertional dyspnea 05/22/2014  . Chest pain 05/22/2014  . Carotid artery plaque 05/22/2014  . History of syncope 05/22/2014  . Peripheral neuropathy, idiopathic 10/07/2013  . Benign fundic gland polyps of stomach 05/06/2013  . Personal history of colonic polyp-Sessile serrated polyp 04/30/2013  . RUQ pain 04/23/2013  . RLQ abdominal pain 04/23/2013  . Diarrhea  04/11/2013  . Need for prophylactic vaccination and inoculation against influenza 10/10/2012  . Mediastinal mass 06/22/2011  . Hypothyroidism 05/16/2011  . History of sarcoma of soft tissue 05/16/2011  . Dermatofibrosarcoma protubera of flank 07/11/2010    Expected post op course  Plan: Swallow eval this am  If swallow study normal, will advance to clears   LOS: 1 day     .Rosario Adie, [facility], [place]   05/13/2017 8:51 AM

## 2017-05-14 MED ORDER — TRAMADOL HCL 50 MG PO TABS: 50.0000 mg | ORAL_TABLET | Freq: Four times a day (QID) | ORAL | 0 refills | Status: DC | PRN

## 2017-05-14 MED ORDER — TRAMADOL HCL 50 MG PO TABS: 50.0000 mg | ORAL_TABLET | Freq: Four times a day (QID) | ORAL | Status: DC | PRN

## 2017-05-14 MED ORDER — DIPHENHYDRAMINE HCL 12.5 MG/5ML PO ELIX
25.0000 mg | ORAL_SOLUTION | Freq: Once | ORAL | Status: DC
  Filled 2017-05-14: qty 10

## 2017-05-14 MED ORDER — OXYCODONE HCL 5 MG PO TABS: 5.0000 mg | ORAL_TABLET | ORAL | Status: DC | PRN

## 2017-05-14 NOTE — Discharge Summary (Signed)
Physician Discharge Summary  Patient ID: Connie Dillon MRN: [id number] DOB/AGE: 1955/11/04 62 y.o.  Admit date: 05/12/2017 Discharge date: 05/14/2017  Admission Diagnoses: GERD  Discharge Diagnoses: GERD Principal Problem:   Status post Nissen fundoplication May 9735   Discharged Condition: good  Hospital Course: Pt admitted after surgery.  Swallow eval on POD 1 neg for leak or stricture.  Diet advanced as tolerated.  By POD 2 she was tolerating a liquid diet and her pain was minimal.  She was felt to be ready for d/c home  Consults: None  Significant Diagnostic Studies: labs: cbc  Treatments: IV hydration, analgesia: acetaminophen and surgery: lap Nissen  Discharge Exam: Blood pressure 116/75, pulse 96, temperature 98.1 F (36.7 C), temperature source Oral, resp. rate 14, height 5' 4.5" (1.638 m), weight 75.3 kg (166 lb), SpO2 96 %. General appearance: alert and cooperative GI: soft, nontender Incision/Wound:clean, dry, intact  Disposition: Home   Allergies as of 05/14/2017      Reactions   Cefdinir Other (See Comments)   C-Diff   Clindamycin/lincomycin Other (See Comments)   C-Diff, dehydration   Doxycycline Other (See Comments)   GERD   Erythrocin [erythromycin] Hives   Metformin And Related Diarrhea   Other Other (See Comments)   All Mycins   Peanuts [peanut Oil] Hives, Other (See Comments)   ALL NUTS.     Statins Other (See Comments)   Gain weight/muscle pains   Zicam Cold Remedy [erysidoron #1] Hives, Itching   Amoxicillin Hives, Rash   Latex Rash   Sudafed [pseudoephedrine Hcl] Palpitations, Other (See Comments)   hyper   Sulfa Antibiotics Hives, Rash      Medication List    STOP taking these medications   pantoprazole 40 MG tablet Commonly known as:  PROTONIX     TAKE these medications   chlorpheniramine 4 MG tablet Commonly known as:  CHLOR-TRIMETON Take 4 mg by mouth 2 (two) times daily as needed for allergies.   DULoxetine 60 MG  capsule Commonly known as:  CYMBALTA Take 60 mg by mouth daily.   gabapentin 400 MG capsule Commonly known as:  NEURONTIN Take 800 mg by mouth 3 (three) times daily.   ibuprofen 200 MG tablet Commonly known as:  ADVIL,MOTRIN Take 400 mg by mouth every 6 (six) hours as needed for headache or moderate pain.   levothyroxine 50 MCG tablet Commonly known as:  SYNTHROID, LEVOTHROID Take 1 tablet (50 mcg total) by mouth daily.   meloxicam 15 MG tablet Commonly known as:  MOBIC Take 15 mg by mouth daily as needed for pain.   traMADol 50 MG tablet Commonly known as:  ULTRAM Take 1-2 tablets (50-100 mg total) by mouth every 6 (six) hours as needed for moderate pain or severe pain.   VISINE OP Place 2 drops into both eyes daily as needed (for itching).      Follow-up Information    Johnathan Hausen, MD. Go to.   Specialty:  General Surgery Why:  as scheduled Contact information: [place] [place] [address] [phone]           Signed: Rosario Adie 02/12/9796, 9:21 AM

## 2017-05-14 NOTE — Discharge Instructions (Addendum)
LAPAROSCOPIC SURGERY: POST OP INSTRUCTIONS  1. DIET: Drink a full liquid diet for the first week after surgery, then switch to a pureed diet for 3 more weeks.  Be sure to include lots of protein daily.   2. Take your usually prescribed home medications unless otherwise directed. 3. PAIN CONTROL: a. Pain is best controlled by a usual combination of three different methods TOGETHER: i. Ice/Heat ii. Over the counter pain medication iii. Prescription pain medication b. Most patients will experience some swelling and bruising around the incisions.  Ice packs or heating pads (30-60 minutes up to 6 times a day) will help. Use ice for the first few days to help decrease swelling and bruising, then switch to heat to help relax tight/sore spots and speed recovery.  Some people prefer to use ice alone, heat alone, alternating between ice & heat.  Experiment to what works for you.  Swelling and bruising can take several weeks to resolve.   c. It is helpful to take an over-the-counter pain medication regularly for the first few weeks.  Choose one of the following that works best for you: i. Naproxen (Aleve, etc)  Two 220mg  tabs twice a day ii. Ibuprofen (Advil, etc) Three 200mg  tabs four times a day (every meal & bedtime) d. A  prescription for pain medication (such as percocet, vicodin, oxycodone, hydrocodone, etc) should be given to you upon discharge.  Take your pain medication as prescribed.  i. If you are having problems/concerns with the prescription medicine (does not control pain, nausea, vomiting, rash, itching, etc), please call us [phone] to see if we need to switch you to a different pain medicine that will work better for you and/or control your side effect better. ii. If you need a refill on your pain medication, please contact your pharmacy.  They will contact our office to request authorization. Prescriptions will not be filled after 5 pm or on week-ends.   4. Avoid getting constipated.   Between the surgery and the pain medications, it is common to experience some constipation.  Increasing fluid intake and taking a fiber supplement (such as Metamucil, Citrucel, FiberCon, MiraLax, etc) 1-2 times a day regularly will usually help prevent this problem from occurring.  A mild laxative (prune juice, Milk of Magnesia, MiraLax, etc) should be taken according to package directions if there are no bowel movements after 48 hours.   5. Watch out for diarrhea.  If you have many loose bowel movements, simplify your diet to bland foods & liquids for a few days.  Stop any stool softeners and decrease your fiber supplement.  Switching to mild anti-diarrheal medications (Kayopectate, Pepto Bismol) can help.  If this worsens or does not improve, please call us. 6. Wash / shower every day.  You may shower over the dressings as they are waterproof.  Continue to shower over incision(s) after the dressing is off. 7. Remove your waterproof bandages 5 days after surgery.  You may leave the incision open to air.  You may replace a dressing/Band-Aid to cover the incision for comfort if you wish.  8. ACTIVITIES as tolerated:   a. You may resume regular (light) daily activities beginning the next day--such as daily self-care, walking, climbing stairs--gradually increasing activities as tolerated.  If you can walk 30 minutes without difficulty, it is safe to try more intense activity such as jogging, treadmill, bicycling, low-impact aerobics, swimming, etc. b. Save the most intensive and strenuous activity for last such as sit-ups, heavy lifting, contact  sports, etc  Refrain from any heavy lifting or straining until you are off narcotics for pain control.   c. DO NOT PUSH THROUGH PAIN.  Let pain be your guide: If it hurts to do something, don't do it.  Pain is your body warning you to avoid that activity for another week until the pain goes down. d. You may drive when you are no longer taking prescription pain  medication, you can comfortably wear a seatbelt, and you can safely maneuver your car and apply brakes. e. Dennis Bast may have sexual intercourse when it is comfortable.  9. FOLLOW UP in our office a. Please call [facility] at [phone] to set up an appointment to see your surgeon in the office for a follow-up appointment approximately 2-3 weeks after your surgery. b. Make sure that you call for this appointment the day you arrive home to insure a convenient appointment time. 10. IF YOU HAVE DISABILITY OR FAMILY LEAVE FORMS, BRING THEM TO THE OFFICE FOR PROCESSING.  DO NOT GIVE THEM TO YOUR DOCTOR.   WHEN TO CALL us [phone]: 1. Poor pain control 2. Reactions / problems with new medications (rash/itching, nausea, etc)  3. Fever over 101.5 F (38.5 C) 4. Inability to urinate 5. Nausea and/or vomiting 6. Worsening swelling or bruising 7. Continued bleeding from incision. 8. Increased pain, redness, or drainage from the incision   The clinic staff is available to answer your questions during regular business hours (8:30am-5pm).  Please dont hesitate to call and ask to speak to one of our nurses for clinical concerns.   If you have a medical emergency, go to the nearest emergency room or call 911.  A surgeon from [facility] is always on call at the [facility], [address], [place], [place], [place]  [address] ? MAIN: [phone] ? TOLL FREE: [phone] ?  FAX [phone] [email or web address]

## 2017-05-14 NOTE — Progress Notes (Signed)
Pt complaining of being very anxious and not being able to sleep. Requesting benedryl and advil. MD notified. Awaiting call back

## 2017-05-14 NOTE — Progress Notes (Signed)
Patient dc'd home. Discharge instructions and Rx given to the patient. She verbalized understanding and all questions were answered. She left the floor in stable condition, accompanied by staff

## 2017-05-15 ENCOUNTER — Encounter (HOSPITAL_COMMUNITY): Payer: Self-pay | Admitting: Surgery

## 2017-05-26 DIAGNOSIS — M545 Low back pain: Secondary | ICD-10-CM | POA: Diagnosis not present

## 2017-05-26 DIAGNOSIS — M7061 Trochanteric bursitis, right hip: Secondary | ICD-10-CM | POA: Diagnosis not present

## 2017-05-26 DIAGNOSIS — M7062 Trochanteric bursitis, left hip: Secondary | ICD-10-CM | POA: Diagnosis not present

## 2017-06-06 ENCOUNTER — Ambulatory Visit
Admission: RE | Admit: 2017-06-06 | Discharge: 2017-06-06 | Disposition: A | Discharge status: Home / Self Care | Payer: PPO | Source: Ambulatory Visit | Attending: Internal Medicine | Admitting: Internal Medicine

## 2017-06-06 ENCOUNTER — Other Ambulatory Visit: Payer: Self-pay | Admitting: Internal Medicine

## 2017-06-06 DIAGNOSIS — R1031 Right lower quadrant pain: Secondary | ICD-10-CM | POA: Diagnosis not present

## 2017-06-06 DIAGNOSIS — K859 Acute pancreatitis without necrosis or infection, unspecified: Secondary | ICD-10-CM | POA: Diagnosis not present

## 2017-06-06 MED ORDER — IOPAMIDOL (ISOVUE-300) INJECTION 61%
100.0000 mL | Freq: Once | INTRAVENOUS | Status: AC | PRN
  Administered 2017-06-06: 100 mL via INTRAVENOUS

## 2017-06-08 ENCOUNTER — Other Ambulatory Visit: Payer: Self-pay | Admitting: Internal Medicine

## 2017-06-09 DIAGNOSIS — R748 Abnormal levels of other serum enzymes: Secondary | ICD-10-CM | POA: Diagnosis not present

## 2017-06-13 ENCOUNTER — Other Ambulatory Visit (HOSPITAL_COMMUNITY): Payer: Self-pay | Admitting: Surgery

## 2017-06-13 DIAGNOSIS — R1011 Right upper quadrant pain: Secondary | ICD-10-CM

## 2017-06-14 ENCOUNTER — Ambulatory Visit (HOSPITAL_COMMUNITY): Payer: PPO

## 2017-06-20 ENCOUNTER — Telehealth: Payer: Self-pay | Admitting: Internal Medicine

## 2017-06-20 DIAGNOSIS — K858 Other acute pancreatitis without necrosis or infection: Secondary | ICD-10-CM | POA: Diagnosis not present

## 2017-06-20 DIAGNOSIS — R109 Unspecified abdominal pain: Secondary | ICD-10-CM | POA: Diagnosis not present

## 2017-06-20 NOTE — Telephone Encounter (Signed)
I left a detailed message with karla for appt on 06/22/17 3:15 with Ellouise Newer, PA

## 2017-06-20 NOTE — Telephone Encounter (Signed)
Karla at Dr. Sharin Mons office is calling to request pt be seen today or tomorrow for pancreatitis and abd pain. Pls call Florentina Jenny at [phone] or Dr. Coralyn Mark at [phone].

## 2017-06-22 ENCOUNTER — Ambulatory Visit: Payer: Self-pay | Admitting: Physician Assistant

## 2017-07-31 ENCOUNTER — Other Ambulatory Visit: Payer: Self-pay

## 2017-07-31 NOTE — Patient Outreach (Signed)
[facility]) Care Management  07/31/2017  Connie Dillon 1955-12-11 [id number]  TELEPHONE SCREENING Referral date: 07/20/17 Referral source:  [employer] team advantage Referral reason: assistance with copayment Insurance: Health team advantage  Telephone call to patient regarding health team advantage referral. Patient would not verify HIPAA.  Patient states she would not discuss anything because she does not know who is calling.  RNCM informed patient she was a Therapist, sports with [place] care network calling on behalf of health team advantage. Patient refused to engage call.    PLAN: RNCM will close patient due to refusal to engage call.  RNCM will send patient outreach letter to attempt contact.  RNCM will send closure notification to patients primary MD.   Quinn Plowman RN,BSN,CCM [facility] Telephonic  [phone]

## 2017-08-28 ENCOUNTER — Ambulatory Visit (INDEPENDENT_AMBULATORY_CARE_PROVIDER_SITE_OTHER): Payer: PPO | Admitting: Internal Medicine

## 2017-08-28 ENCOUNTER — Encounter: Payer: Self-pay | Admitting: Internal Medicine

## 2017-08-28 ENCOUNTER — Encounter

## 2017-08-28 VITALS — BP 126/84 | HR 92 | Ht 64.5 in | Wt 159.0 lb

## 2017-08-28 DIAGNOSIS — K9289 Other specified diseases of the digestive system: Secondary | ICD-10-CM

## 2017-08-28 DIAGNOSIS — Z9889 Other specified postprocedural states: Secondary | ICD-10-CM | POA: Diagnosis not present

## 2017-08-28 DIAGNOSIS — K580 Irritable bowel syndrome with diarrhea: Secondary | ICD-10-CM | POA: Diagnosis not present

## 2017-08-28 MED ORDER — DICYCLOMINE HCL 20 MG PO TABS: 20.0000 mg | ORAL_TABLET | Freq: Three times a day (TID) | ORAL | 2 refills | Status: DC

## 2017-08-28 NOTE — Patient Instructions (Addendum)
  Please download the FODMAP APP from [facility].  We are giving you information to read about this web site and also information on gas.   We have sent the following medications to your pharmacy for you to pick up at your convenience: Dicyclomine    Please call and get an appointment with Dr Kaylyn Lim at [facility]. There phone # is [phone].   We will see you back in October.    I appreciate the opportunity to care for you. Silvano Rusk, MD, [facility]

## 2017-08-28 NOTE — Progress Notes (Signed)
Connie Dillon 62 y.o. 1955/10/03 [id number]  Assessment & Plan:   Encounter Diagnoses  Name Primary?  . Gas bloat syndrome Yes  . S/P Nissen fundoplication (without gastrostomy tube) procedure   . Irritable bowel syndrome with diarrhea     It seems like she has a gas bloat syndrome.  She is had C. difficile in the past and she says she does not have C. difficile.  She should follow-up with Dr. Hassell Done I think and I will communicate with him about this as well.  I wonder if the lipase was not a postoperative phenomenon since it can come from the gastric tissues as well as the intestine.   I reviewed that she had a benign lymph node.  She may benefit from a FODMAPs diet she wants to try on her own before pursuing some type of service they can provide this.  I have suggested she get the app from [facility] also.  To my knowledge we do not have dietitians here that are well equipped to handle that. Dicyclomine as needed is prescribed also.  She will see me again in October.  I appreciate the opportunity to care for this patient. CC: [name], MD Dr. Kaylyn Lim  Subjective:   Chief Complaint: Diarrhea  HPI Patient is here with complaints of postprandial gas bloating and loose watery stools after her laparoscopic hiatal hernia repair and Nissen fundoplication in May.  Her dysphagia and GERD type symptoms are all better but she is having these problems.  She also had pain and elevated lipase not long after the surgery and was seen by her primary care providers and the lipase was 295 on May 28 the same month she had had her surgery, she had right lower quadrant pain remainder of her labs were okay including CBC and electrolytes and chemistries.  Follow-up lipase on June 11 was normal.  CT scan showed postoperative changes and some hepatic steatosis.  The pain resolved essentially without treatment  We reviewed things and her surgical pathology indicates a lymph node was  removed and a biopsy was benign which I believe was not mentioned in the operative note.  She was concerned because of her history of dermatofibrosarcoma and I did review this with her and reassured her I think.  She has not yet been back to Dr. Hassell Done after an initial visit and is unclear as to whether she needs one. Allergies  Allergen Reactions  . Cefdinir Other (See Comments)    C-Diff  . Clindamycin/Lincomycin Other (See Comments)    C-Diff, dehydration  . Doxycycline Other (See Comments)    GERD  . Erythrocin [Erythromycin] Hives  . Metformin And Related Diarrhea  . Other Other (See Comments)    All Mycins  . Peanuts [Peanut Oil] Hives and Other (See Comments)    ALL NUTS.    . Statins Other (See Comments)    Gain weight/muscle pains  . Zicam Cold Remedy [Erysidoron #1] Hives and Itching  . Amoxicillin Hives and Rash  . Latex Rash  . Sudafed [Pseudoephedrine Hcl] Palpitations and Other (See Comments)    hyper  . Sulfa Antibiotics Hives and Rash   Current Meds  Medication Sig  . chlorpheniramine (CHLOR-TRIMETON) 4 MG tablet Take 4 mg by mouth 2 (two) times daily as needed for allergies.  . DULoxetine (CYMBALTA) 60 MG capsule Take 60 mg by mouth daily.  Marland Kitchen gabapentin (NEURONTIN) 400 MG capsule Take 800 mg by mouth 3 (three) times daily.  Marland Kitchen  ibuprofen (ADVIL,MOTRIN) 200 MG tablet Take 400 mg by mouth every 6 (six) hours as needed for headache or moderate pain.   Marland Kitchen levothyroxine (SYNTHROID, LEVOTHROID) 50 MCG tablet Take 1 tablet (50 mcg total) by mouth daily.  . meloxicam (MOBIC) 15 MG tablet Take 15 mg by mouth daily as needed for pain.   . Tetrahydrozoline HCl (VISINE OP) Place 2 drops into both eyes daily as needed (for itching).  . [DISCONTINUED] traMADol (ULTRAM) 50 MG tablet Take 1-2 tablets (50-100 mg total) by mouth every 6 (six) hours as needed for moderate pain or severe pain.   Past Medical History:  Diagnosis Date  . Adenomatous colon polyp   . Anxiety   . Aortic  atherosclerosis ([place]) 2017   on CT scan  . Benign fundic gland polyps of stomach 05/06/2013  . Clostridium difficile infection 04/2015  . DDD (degenerative disc disease), lumbar   . Depression   . 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 Ocie Cornfield in 10/2010. [facility])  . Dyspnea    with exertion   . GERD (gastroesophageal reflux disease)   . Hepatic steatosis 2017   on CT scan  . History of hiatal hernia   . History of kidney stones   . History of SCC (squamous cell carcinoma) of skin   . Hyperlipidemia   . Hypothyroid   . IBS (irritable bowel syndrome)   . IFG (impaired fasting glucose)   . Narcotic abuse ([place])    distant history of muscle relaxant, sleeping pill  . Personal history of colonic polyp-Sessile serrated polyp 04/30/2013  . Pneumonia    hx of in 80s  . Seasonal allergic rhinitis   . Small fiber polyneuropathy    symmetric, chronic pain   Past Surgical History:  Procedure Laterality Date  . CHOLECYSTECTOMY    . COLONOSCOPY W/ BIOPSIES    . ESOPHAGEAL MANOMETRY N/A 03/13/2017   Procedure: ESOPHAGEAL MANOMETRY (EM);  Surgeon: Mauri Pole, MD;  Location: [facility];  Service: Endoscopy;  Laterality: N/A;  . ESOPHAGOGASTRODUODENOSCOPY (EGD) WITH ESOPHAGEAL DILATION  02/2017  . LAPAROSCOPIC ENDOMETRIOSIS FULGURATION  1980's   patient denies at preop on 05/03/2017   . LAPAROSCOPIC NISSEN FUNDOPLICATION N/A 01/12/2438   Procedure: LAPAROSCOPIC NISSEN FUNDOPLICATION;  Surgeon: Johnathan Hausen, MD;  Location: [facility];  Service: General;  Laterality: N/A;  . lipoma removal  2011  . Wide excision of dermatofibrosarcoma of right flank  10/2010   by a Dr. Ocie Cornfield in [facility].    Social History   Social History Narrative   She lives alone and has two grown children.   She works part-time in [employer], research (medical).   Highest level of education:  College degree   family history includes Alcoholism in her mother; Breast cancer in her  paternal aunt; Colon cancer in her paternal aunt; Coronary artery disease in her father; Dementia in her mother; Heart attack (age of onset: 38) in her father; Hyperlipidemia in her father and sister; Hypertension in her father; Hypothyroidism in her mother; Stroke in her father.   Review of Systems  As above Objective:   Physical Exam BP 126/84   Pulse 92   Ht 5' 4.5" (1.638 m)   Wt 159 lb (72.1 kg)   BMI 26.87 kg/m  NAD Eyes anicteric abd soft NT well healed scars  I have reviewed [facility] primary care notes and labs operative notes pathology reports etc. as mentioned in the HPI 25 minutes time spent with  patient > half in counseling coordination of care

## 2017-08-31 ENCOUNTER — Encounter: Payer: Self-pay | Admitting: Internal Medicine

## 2017-08-31 DIAGNOSIS — Z1389 Encounter for screening for other disorder: Secondary | ICD-10-CM | POA: Diagnosis not present

## 2017-08-31 DIAGNOSIS — E039 Hypothyroidism, unspecified: Secondary | ICD-10-CM | POA: Diagnosis not present

## 2017-08-31 DIAGNOSIS — Z0000 Encounter for general adult medical examination without abnormal findings: Secondary | ICD-10-CM | POA: Diagnosis not present

## 2017-08-31 DIAGNOSIS — G629 Polyneuropathy, unspecified: Secondary | ICD-10-CM | POA: Diagnosis not present

## 2017-08-31 DIAGNOSIS — E7800 Pure hypercholesterolemia, unspecified: Secondary | ICD-10-CM | POA: Diagnosis not present

## 2017-08-31 DIAGNOSIS — R7301 Impaired fasting glucose: Secondary | ICD-10-CM | POA: Diagnosis not present

## 2017-08-31 DIAGNOSIS — R5383 Other fatigue: Secondary | ICD-10-CM | POA: Diagnosis not present

## 2017-08-31 DIAGNOSIS — R400 Somnolence: Secondary | ICD-10-CM | POA: Diagnosis not present

## 2017-08-31 DIAGNOSIS — K219 Gastro-esophageal reflux disease without esophagitis: Secondary | ICD-10-CM | POA: Diagnosis not present

## 2017-08-31 DIAGNOSIS — F325 Major depressive disorder, single episode, in full remission: Secondary | ICD-10-CM | POA: Diagnosis not present

## 2017-08-31 DIAGNOSIS — I700 Atherosclerosis of aorta: Secondary | ICD-10-CM | POA: Diagnosis not present

## 2017-09-14 DIAGNOSIS — J0100 Acute maxillary sinusitis, unspecified: Secondary | ICD-10-CM | POA: Diagnosis not present

## 2017-09-27 DIAGNOSIS — K1120 Sialoadenitis, unspecified: Secondary | ICD-10-CM | POA: Diagnosis not present

## 2017-09-28 DIAGNOSIS — R682 Dry mouth, unspecified: Secondary | ICD-10-CM | POA: Diagnosis not present

## 2017-09-28 DIAGNOSIS — K111 Hypertrophy of salivary gland: Secondary | ICD-10-CM | POA: Diagnosis not present

## 2017-09-28 DIAGNOSIS — K1120 Sialoadenitis, unspecified: Secondary | ICD-10-CM | POA: Diagnosis not present

## 2017-10-18 ENCOUNTER — Ambulatory Visit: Payer: Self-pay | Admitting: Internal Medicine

## 2017-10-19 DIAGNOSIS — M5416 Radiculopathy, lumbar region: Secondary | ICD-10-CM | POA: Diagnosis not present

## 2017-10-19 DIAGNOSIS — M545 Low back pain: Secondary | ICD-10-CM | POA: Diagnosis not present

## 2017-11-03 DIAGNOSIS — E161 Other hypoglycemia: Secondary | ICD-10-CM | POA: Diagnosis not present

## 2017-11-03 DIAGNOSIS — R7301 Impaired fasting glucose: Secondary | ICD-10-CM | POA: Diagnosis not present

## 2017-11-24 DIAGNOSIS — R042 Hemoptysis: Secondary | ICD-10-CM | POA: Diagnosis not present

## 2017-11-24 DIAGNOSIS — R1312 Dysphagia, oropharyngeal phase: Secondary | ICD-10-CM | POA: Diagnosis not present

## 2017-11-27 ENCOUNTER — Other Ambulatory Visit: Payer: Self-pay | Admitting: Otolaryngology

## 2017-11-27 DIAGNOSIS — R1312 Dysphagia, oropharyngeal phase: Secondary | ICD-10-CM

## 2018-01-24 ENCOUNTER — Ambulatory Visit (INDEPENDENT_AMBULATORY_CARE_PROVIDER_SITE_OTHER): Payer: PPO | Admitting: Internal Medicine

## 2018-01-24 ENCOUNTER — Encounter: Payer: Self-pay | Admitting: Internal Medicine

## 2018-01-24 VITALS — BP 114/78 | HR 100 | Ht 64.5 in | Wt 165.0 lb

## 2018-01-24 DIAGNOSIS — Z86010 Personal history of colonic polyps: Secondary | ICD-10-CM

## 2018-01-24 DIAGNOSIS — K921 Melena: Secondary | ICD-10-CM

## 2018-01-24 DIAGNOSIS — K9289 Other specified diseases of the digestive system: Secondary | ICD-10-CM

## 2018-01-24 DIAGNOSIS — R194 Change in bowel habit: Secondary | ICD-10-CM

## 2018-01-24 NOTE — Patient Instructions (Signed)
You have been scheduled for a colonoscopy. Please follow written instructions given to you at your visit today.  Please pick up your prep supplies at the pharmacy within the next 1-3 days. If you use inhalers (even only as needed), please bring them with you on the day of your procedure.   I appreciate the opportunity to care for you. Carl Gessner, MD, FACG 

## 2018-01-24 NOTE — Progress Notes (Signed)
Connie Dillon 63 y.o. Jan 22, 1955 [id number]  Assessment & Plan:   Encounter Diagnoses  Name Primary?  . Blood in stool Yes  . Change in bowel habits   . Hx of colonic polyp   . Gas bloat syndrome     Given the constellation of signs and symptoms I think it is reasonable to go ahead and perform a colonoscopy for diagnostic purposes.  Further plans pending that result.    The risks and benefits as well as alternatives of endoscopic procedure(s) have been discussed and reviewed. All questions answered. The patient agrees to proceed.  I appreciate the opportunity to care for this patient. CC: [name], MD   Subjective:   Chief Complaint: Rectal bleeding  HPI The patient is here because of bleeding, she has occasional bright red blood per wiping but several days ago she passed what she said was a large amount of red blood.  She continues with gas bloat symptoms after her hiatal hernia repair and feels like she has to defecate and has incomplete defecation.  Her stools have been thin lately as well.  She is due for a repeat colonoscopy on a routine basis later this year.  History of colon polyp.  She remains worried that she could have a recurrence of her dermatofibrosarcoma as well. Allergies  Allergen Reactions  . Cefdinir Other (See Comments)    C-Diff  . Clindamycin/Lincomycin Other (See Comments)    C-Diff, dehydration  . Doxycycline Other (See Comments)    GERD  . Erythrocin [Erythromycin] Hives  . Metformin And Related Diarrhea  . Other Other (See Comments)    All Mycins  . Peanuts [Peanut Oil] Hives and Other (See Comments)    ALL NUTS.    . Statins Other (See Comments)    Gain weight/muscle pains  . Zicam Cold Remedy [Erysidoron #1] Hives and Itching  . Amoxicillin Hives and Rash  . Latex Rash  . Sudafed [Pseudoephedrine Hcl] Palpitations and Other (See Comments)    hyper  . Sulfa Antibiotics Hives and Rash   Current Meds  Medication Sig  .  chlorpheniramine (CHLOR-TRIMETON) 4 MG tablet Take 4 mg by mouth 2 (two) times daily as needed for allergies.  Marland Kitchen dicyclomine (BENTYL) 20 MG tablet Take 1 tablet (20 mg total) by mouth 4 (four) times daily -  before meals and at bedtime. As needed  . DULoxetine (CYMBALTA) 60 MG capsule Take 60 mg by mouth daily.  Marland Kitchen gabapentin (NEURONTIN) 400 MG capsule Take 800 mg by mouth 3 (three) times daily.  Marland Kitchen ibuprofen (ADVIL,MOTRIN) 200 MG tablet Take 400 mg by mouth every 6 (six) hours as needed for headache or moderate pain.   Marland Kitchen levothyroxine (SYNTHROID, LEVOTHROID) 50 MCG tablet Take 1 tablet (50 mcg total) by mouth daily.  . meloxicam (MOBIC) 15 MG tablet Take 15 mg by mouth daily as needed for pain.   . rosuvastatin (CRESTOR) 5 MG tablet Take 1 tablet by mouth daily.  . Tetrahydrozoline HCl (VISINE OP) Place 2 drops into both eyes daily as needed (for itching).   Past Medical History:  Diagnosis Date  . Adenomatous colon polyp   . Anxiety   . Aortic atherosclerosis ([place]) 2017   on CT scan  . Benign fundic gland polyps of stomach 05/06/2013  . Clostridium difficile infection 04/2015  . DDD (degenerative disc disease), lumbar   . Depression   . 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 Ocie Cornfield  in 10/2010. [facility])  . Dyspnea    with exertion   . GERD (gastroesophageal reflux disease)   . Hepatic steatosis 2017   on CT scan  . History of hiatal hernia   . History of kidney stones   . History of SCC (squamous cell carcinoma) of skin   . Hyperlipidemia   . Hypothyroid   . IBS (irritable bowel syndrome)   . IFG (impaired fasting glucose)   . Narcotic abuse ([place])    distant history of muscle relaxant, sleeping pill  . Personal history of colonic polyp-Sessile serrated polyp 04/30/2013  . Pneumonia    hx of in 80s  . Seasonal allergic rhinitis   . Small fiber polyneuropathy    symmetric, chronic pain   Past Surgical History:  Procedure  Laterality Date  . CHOLECYSTECTOMY    . COLONOSCOPY W/ BIOPSIES    . ESOPHAGEAL MANOMETRY N/A 03/13/2017   Procedure: ESOPHAGEAL MANOMETRY (EM);  Surgeon: Mauri Pole, MD;  [facility];  Service: Endoscopy;  Laterality: N/A;  . ESOPHAGOGASTRODUODENOSCOPY (EGD) WITH ESOPHAGEAL DILATION  02/2017  . LAPAROSCOPIC ENDOMETRIOSIS FULGURATION  1980's   patient denies at preop on 05/03/2017   . LAPAROSCOPIC NISSEN FUNDOPLICATION N/A 02/18/9369   Procedure: LAPAROSCOPIC NISSEN FUNDOPLICATION;  Surgeon: Johnathan Hausen, MD;  [facility]: [facility];  Service: General;  Laterality: N/A;  . lipoma removal  2011  . Wide excision of dermatofibrosarcoma of right flank  10/2010   by a Dr. Ocie Cornfield in [facility].    Social History   Social History Narrative   She lives alone and has two grown children.   She works part-time in [employer] (medical).   Highest level of education:  College degree   family history includes Alcoholism in her mother; Breast cancer in her paternal aunt; Colon cancer in her paternal aunt; Coronary artery disease in her father; Dementia in her mother; Heart attack (age of onset: 72) in her father; Hyperlipidemia in her father and sister; Hypertension in her father; Hypothyroidism in her mother; Stroke in her father.   Review of Systems As above  Objective:   Physical Exam BP 114/78   Pulse 100   Ht 5' 4.5" (1.638 m)   Wt 165 lb (74.8 kg)   BMI 27.88 kg/m  No acute distress Eyes are anicteric Lungs clear Normal heart sounds The abdomen is obese, mildly tender RLQ at surgical scar  Rectal - June McMurry CMA present, nontender no mass- brown stool

## 2018-01-29 ENCOUNTER — Encounter: Payer: Self-pay | Admitting: Internal Medicine

## 2018-01-30 DIAGNOSIS — M5136 Other intervertebral disc degeneration, lumbar region: Secondary | ICD-10-CM | POA: Diagnosis not present

## 2018-02-12 ENCOUNTER — Ambulatory Visit (AMBULATORY_SURGERY_CENTER): Payer: PPO | Admitting: Internal Medicine

## 2018-02-12 ENCOUNTER — Encounter: Payer: Self-pay | Admitting: Internal Medicine

## 2018-02-12 VITALS — BP 117/49 | HR 88 | Temp 96.8°F | Resp 18 | Ht 64.0 in | Wt 165.0 lb

## 2018-02-12 DIAGNOSIS — D123 Benign neoplasm of transverse colon: Secondary | ICD-10-CM | POA: Diagnosis not present

## 2018-02-12 DIAGNOSIS — K921 Melena: Secondary | ICD-10-CM

## 2018-02-12 DIAGNOSIS — Z86010 Personal history of colonic polyps: Secondary | ICD-10-CM | POA: Diagnosis not present

## 2018-02-12 MED ORDER — SODIUM CHLORIDE 0.9 % IV SOLN: 500.0000 mL | Freq: Once | INTRAVENOUS | Status: DC

## 2018-02-12 NOTE — Progress Notes (Signed)
To PACU, VSS. Report to [email or web address] 

## 2018-02-12 NOTE — Patient Instructions (Addendum)
I removed 2 tiny polyps. Saw diverticulosis and internal hemorrhoids.  I suspect the bleeding was from hemorrhoids. Also can make the stools smaller when they swell.  Nothing bad going on.  I will let you know pathology results and when to have another routine colonoscopy by mail and/or My Chart.  I appreciate the opportunity to care for you. Gatha Mayer, MD, [facility]  Polyp handout given to patient. Diverticulosis handout given to patient. Hemorrhoid handout given to patient. High fiber diet handout given to patient.  Continue present medications.  Repeat colonoscopy recommended.  Date to be determined after pathology results reviewed.  YOU HAD AN ENDOSCOPIC PROCEDURE TODAY AT [facility]:   Refer to the procedure report that was given to you for any specific questions about what was found during the examination.  If the procedure report does not answer your questions, please call your gastroenterologist to clarify.  If you requested that your care partner not be given the details of your procedure findings, then the procedure report has been included in a sealed envelope for you to review at your convenience later.  YOU SHOULD EXPECT: Some feelings of bloating in the abdomen. Passage of more gas than usual.  Walking can help get rid of the air that was put into your GI tract during the procedure and reduce the bloating. If you had a lower endoscopy (such as a colonoscopy or flexible sigmoidoscopy) you may notice spotting of blood in your stool or on the toilet paper. If you underwent a bowel prep for your procedure, you may not have a normal bowel movement for a few days.  Please Note:  You might notice some irritation and congestion in your nose or some drainage.  This is from the oxygen used during your procedure.  There is no need for concern and it should clear up in a day or so.  SYMPTOMS TO REPORT IMMEDIATELY:   Following lower endoscopy (colonoscopy or  flexible sigmoidoscopy):  Excessive amounts of blood in the stool  Significant tenderness or worsening of abdominal pains  Swelling of the abdomen that is new, acute  Fever of 100F or higher  For urgent or emergent issues, a gastroenterologist can be reached at any hour by calling [phone].   DIET:  We do recommend a small meal at first, but then you may proceed to your regular diet.  Drink plenty of fluids but you should avoid alcoholic beverages for 24 hours.  ACTIVITY:  You should plan to take it easy for the rest of today and you should NOT DRIVE or use heavy machinery until tomorrow (because of the sedation medicines used during the test).    FOLLOW UP: Our staff will call the number listed on your records the next business day following your procedure to check on you and address any questions or concerns that you may have regarding the information given to you following your procedure. If we do not reach you, we will leave a message.  However, if you are feeling well and you are not experiencing any problems, there is no need to return our call.  We will assume that you have returned to your regular daily activities without incident.  If any biopsies were taken you will be contacted by phone or by letter within the next 1-3 weeks.  Please call us at [phone] if you have not heard about the biopsies in 3 weeks.    SIGNATURES/CONFIDENTIALITY: You and/or your care partner  have signed paperwork which will be entered into your electronic medical record.  These signatures attest to the fact that that the information above on your After Visit Summary has been reviewed and is understood.  Full responsibility of the confidentiality of this discharge information lies with you and/or your care-partner.

## 2018-02-12 NOTE — Op Note (Signed)
[place] Patient Name: Connie Dillon Procedure Date: 02/12/2018 3:20 PM MRN: [id number] Endoscopist: Gatha Mayer , MD Age: 63 Referring MD:  Date of Birth: 23-Aug-1955 Gender: Female Account #: [id number] Procedure:                Colonoscopy Indications:              Rectal bleeding, Change in stool caliber Medicines:                Propofol per Anesthesia, Monitored Anesthesia Care Procedure:                Pre-Anesthesia Assessment:                           - Prior to the procedure, a History and Physical                            was performed, and patient medications and                            allergies were reviewed. The patient's tolerance of                            previous anesthesia was also reviewed. The risks                            and benefits of the procedure and the sedation                            options and risks were discussed with the patient.                            All questions were answered, and informed consent                            was obtained. Prior Anticoagulants: The patient has                            taken no previous anticoagulant or antiplatelet                            agents. ASA Grade Assessment: II - A patient with                            mild systemic disease. After reviewing the risks                            and benefits, the patient was deemed in                            satisfactory condition to undergo the procedure.                           After obtaining informed consent, the colonoscope  was passed under direct vision. Throughout the                            procedure, the patient's blood pressure, pulse, and                            oxygen saturations were monitored continuously. The                            Colonoscope was introduced through the anus and                            advanced to the the cecum, identified by   appendiceal orifice and ileocecal valve. The                            colonoscopy was somewhat difficult due to                            significant looping. Successful completion of the                            procedure was aided by applying abdominal pressure.                            The patient tolerated the procedure well. The                            quality of the bowel preparation was good. The                            ileocecal valve, appendiceal orifice, and rectum                            were photographed. The bowel preparation used was                            Miralax. Scope In: 3:32:34 PM Scope Out: 3:51:59 PM Scope Withdrawal Time: 0 hours 12 minutes 12 seconds  Total Procedure Duration: 0 hours 19 minutes 25 seconds  Findings:                 The perianal and digital rectal examinations were                            normal.                           Two sessile polyps were found in the transverse                            colon. The polyps were diminutive in size. These                            polyps were removed with a cold biopsy forceps.  Resection and retrieval were complete. Verification                            of patient identification for the specimen was                            done. Estimated blood loss was minimal.                           Multiple diverticula were found in the entire colon.                           Internal hemorrhoids were found.                           The exam was otherwise without abnormality on                            direct and retroflexion views. Complications:            No immediate complications. Estimated Blood Loss:     Estimated blood loss was minimal. Impression:               - Two diminutive polyps in the transverse colon,                            removed with a cold biopsy forceps. Resected and                            retrieved.                           -  Diverticulosis in the entire examined colon.                           - Internal hemorrhoids.                           - The examination was otherwise normal on direct                            and retroflexion views. Recommendation:           - Patient has a contact number available for                            emergencies. The signs and symptoms of potential                            delayed complications were discussed with the                            patient. Return to normal activities tomorrow.                            Written discharge instructions were provided to the  patient.                           - High fiber diet.                           - Continue present medications.                           - Repeat colonoscopy is recommended. The                            colonoscopy date will be determined after pathology                            results from today's exam become available for                            review. Gatha Mayer, MD 02/12/2018 3:58:42 PM This report has been signed electronically.

## 2018-02-13 ENCOUNTER — Telehealth: Payer: Self-pay

## 2018-02-13 ENCOUNTER — Telehealth: Payer: Self-pay | Admitting: *Deleted

## 2018-02-13 NOTE — Telephone Encounter (Signed)
  Follow up Call-  Call back number 02/12/2018 02/17/2017  Post procedure Call Back phone  # [phone]  Permission to leave phone message Yes Yes  Some recent data might be hidden     Patient questions:  Message left to call us if necessary.  Second call.

## 2018-02-13 NOTE — Telephone Encounter (Signed)
Called [phone] and left a messaged we tried to reach pt for a follow up call. maw

## 2018-02-20 ENCOUNTER — Encounter: Payer: Self-pay | Admitting: Internal Medicine

## 2018-02-21 NOTE — Progress Notes (Signed)
Unable to place HM in chart.  5 year recall is in computer.

## 2018-03-06 DIAGNOSIS — I700 Atherosclerosis of aorta: Secondary | ICD-10-CM | POA: Diagnosis not present

## 2018-03-06 DIAGNOSIS — R7301 Impaired fasting glucose: Secondary | ICD-10-CM | POA: Diagnosis not present

## 2018-03-06 DIAGNOSIS — E7800 Pure hypercholesterolemia, unspecified: Secondary | ICD-10-CM | POA: Diagnosis not present

## 2018-03-06 DIAGNOSIS — G629 Polyneuropathy, unspecified: Secondary | ICD-10-CM | POA: Diagnosis not present

## 2018-03-17 DIAGNOSIS — M5416 Radiculopathy, lumbar region: Secondary | ICD-10-CM | POA: Diagnosis not present

## 2018-08-30 DIAGNOSIS — L820 Inflamed seborrheic keratosis: Secondary | ICD-10-CM | POA: Diagnosis not present

## 2018-08-30 DIAGNOSIS — Z1283 Encounter for screening for malignant neoplasm of skin: Secondary | ICD-10-CM | POA: Diagnosis not present

## 2018-08-30 DIAGNOSIS — L308 Other specified dermatitis: Secondary | ICD-10-CM | POA: Diagnosis not present

## 2018-08-30 DIAGNOSIS — D225 Melanocytic nevi of trunk: Secondary | ICD-10-CM | POA: Diagnosis not present

## 2018-09-07 DIAGNOSIS — E7800 Pure hypercholesterolemia, unspecified: Secondary | ICD-10-CM | POA: Diagnosis not present

## 2018-09-07 DIAGNOSIS — G629 Polyneuropathy, unspecified: Secondary | ICD-10-CM | POA: Diagnosis not present

## 2018-09-07 DIAGNOSIS — E039 Hypothyroidism, unspecified: Secondary | ICD-10-CM | POA: Diagnosis not present

## 2018-09-07 DIAGNOSIS — F325 Major depressive disorder, single episode, in full remission: Secondary | ICD-10-CM | POA: Diagnosis not present

## 2018-09-07 DIAGNOSIS — Z23 Encounter for immunization: Secondary | ICD-10-CM | POA: Diagnosis not present

## 2018-09-07 DIAGNOSIS — K219 Gastro-esophageal reflux disease without esophagitis: Secondary | ICD-10-CM | POA: Diagnosis not present

## 2018-09-07 DIAGNOSIS — Z1389 Encounter for screening for other disorder: Secondary | ICD-10-CM | POA: Diagnosis not present

## 2018-09-07 DIAGNOSIS — I700 Atherosclerosis of aorta: Secondary | ICD-10-CM | POA: Diagnosis not present

## 2018-09-07 DIAGNOSIS — M5136 Other intervertebral disc degeneration, lumbar region: Secondary | ICD-10-CM | POA: Diagnosis not present

## 2018-09-07 DIAGNOSIS — Z5181 Encounter for therapeutic drug level monitoring: Secondary | ICD-10-CM | POA: Diagnosis not present

## 2018-09-07 DIAGNOSIS — Z0000 Encounter for general adult medical examination without abnormal findings: Secondary | ICD-10-CM | POA: Diagnosis not present

## 2018-09-07 DIAGNOSIS — R7301 Impaired fasting glucose: Secondary | ICD-10-CM | POA: Diagnosis not present

## 2018-10-04 DIAGNOSIS — M25552 Pain in left hip: Secondary | ICD-10-CM | POA: Diagnosis not present

## 2018-10-30 DIAGNOSIS — M25552 Pain in left hip: Secondary | ICD-10-CM | POA: Diagnosis not present

## 2018-11-21 DIAGNOSIS — M5136 Other intervertebral disc degeneration, lumbar region: Secondary | ICD-10-CM | POA: Diagnosis not present

## 2018-11-21 DIAGNOSIS — M25552 Pain in left hip: Secondary | ICD-10-CM | POA: Diagnosis not present

## 2018-11-22 DIAGNOSIS — M5416 Radiculopathy, lumbar region: Secondary | ICD-10-CM | POA: Diagnosis not present

## 2018-12-10 DIAGNOSIS — R7301 Impaired fasting glucose: Secondary | ICD-10-CM | POA: Diagnosis not present

## 2018-12-12 ENCOUNTER — Other Ambulatory Visit: Payer: Self-pay | Admitting: Internal Medicine

## 2018-12-12 DIAGNOSIS — Z1231 Encounter for screening mammogram for malignant neoplasm of breast: Secondary | ICD-10-CM

## 2018-12-13 DIAGNOSIS — M79622 Pain in left upper arm: Secondary | ICD-10-CM | POA: Diagnosis not present

## 2018-12-13 DIAGNOSIS — R7301 Impaired fasting glucose: Secondary | ICD-10-CM | POA: Diagnosis not present

## 2018-12-14 DIAGNOSIS — M5136 Other intervertebral disc degeneration, lumbar region: Secondary | ICD-10-CM | POA: Diagnosis not present

## 2018-12-14 DIAGNOSIS — M25512 Pain in left shoulder: Secondary | ICD-10-CM | POA: Diagnosis not present

## 2018-12-25 DIAGNOSIS — M7542 Impingement syndrome of left shoulder: Secondary | ICD-10-CM | POA: Diagnosis not present

## 2018-12-25 DIAGNOSIS — M25512 Pain in left shoulder: Secondary | ICD-10-CM | POA: Diagnosis not present

## 2019-01-09 DIAGNOSIS — M7542 Impingement syndrome of left shoulder: Secondary | ICD-10-CM | POA: Diagnosis not present

## 2019-01-17 DIAGNOSIS — M545 Low back pain: Secondary | ICD-10-CM | POA: Diagnosis not present

## 2019-01-17 DIAGNOSIS — M5136 Other intervertebral disc degeneration, lumbar region: Secondary | ICD-10-CM | POA: Diagnosis not present

## 2019-01-22 DIAGNOSIS — M7542 Impingement syndrome of left shoulder: Secondary | ICD-10-CM | POA: Diagnosis not present

## 2019-02-04 DIAGNOSIS — M25552 Pain in left hip: Secondary | ICD-10-CM | POA: Diagnosis not present

## 2019-02-07 ENCOUNTER — Ambulatory Visit: Payer: PPO

## 2019-02-12 ENCOUNTER — Ambulatory Visit: Payer: PPO | Admitting: Internal Medicine

## 2019-02-12 ENCOUNTER — Encounter: Payer: Self-pay | Admitting: Internal Medicine

## 2019-02-12 VITALS — BP 124/62 | HR 88 | Temp 98.4°F | Ht 64.0 in | Wt 168.1 lb

## 2019-02-12 DIAGNOSIS — R1011 Right upper quadrant pain: Secondary | ICD-10-CM | POA: Diagnosis not present

## 2019-02-12 NOTE — Progress Notes (Signed)
Connie Dillon 64 y.o. 1955/09/30 [id number]  Assessment & Plan:   Encounter Diagnosis  Name Primary?  . Abdominal wall pain in right upper quadrant Yes    I have recommended she try some Voltaren gel, and protect this area by minimizing bending twisting etc.  And I will get her an appointment with Dr. Bryan Lemma for evaluation and consideration of abdominal wall injection.  Procedure discussed with her.  I appreciate the opportunity to care for this patient. CC: [name], MD  Subjective:   Chief Complaint: Right upper quadrant pain  HPI The patient is a 64 year old white woman with previous paraesophageal hernia repaired by Dr. Hassell Done with fundoplication in [id number], who presents with about a weeks worth of right upper quadrant pain.  She first noticed it when bending over.  She does not recall an injury.  Is not affected by eating or bowel habits.  It is tender on her ribs.  If she bends or moves it will be worse.  No nausea or vomiting.  No change in medications.  She is currently having physical therapy for her left shoulder and left hip related to a swimming injury.  It is very bothersome and she would like this fixed.  She has not been swimming in 2 months.  She has a history of cholecystectomy this pain is not like that she had more pain in the right infrascapular region then. Allergies  Allergen Reactions  . Cefdinir Other (See Comments)    C-Diff  . Clindamycin/Lincomycin Other (See Comments)    C-Diff, dehydration  . Dicyclomine Hcl     Insomnia and irritability  . Doxycycline Other (See Comments)    GERD  . Erythrocin [Erythromycin] Hives  . Metformin And Related Diarrhea  . Other Other (See Comments)    All Mycins  . Peanuts [Peanut Oil] Hives and Other (See Comments)    ALL NUTS.    . Statins Other (See Comments)    Gain weight/muscle pains  . Zicam Cold Remedy [Erysidoron #1] Hives and Itching  . Amoxicillin Hives and Rash  . Latex Rash  . Sudafed  [Pseudoephedrine Hcl] Palpitations and Other (See Comments)    hyper  . Sulfa Antibiotics Hives and Rash   Current Meds  Medication Sig  . chlorpheniramine (CHLOR-TRIMETON) 4 MG tablet Take 4 mg by mouth 2 (two) times daily as needed for allergies.  Marland Kitchen diclofenac sodium (VOLTAREN) 1 % GEL Place 2 g onto the skin 4 (four) times daily as needed.  . DULoxetine (CYMBALTA) 60 MG capsule Take 60 mg by mouth daily.  Marland Kitchen gabapentin (NEURONTIN) 400 MG capsule Take 800 mg by mouth 3 (three) times daily.  Marland Kitchen ibuprofen (ADVIL,MOTRIN) 200 MG tablet Take 400 mg by mouth every 6 (six) hours as needed for headache or moderate pain.   Marland Kitchen levothyroxine (SYNTHROID, LEVOTHROID) 50 MCG tablet Take 1 tablet (50 mcg total) by mouth daily.  . meloxicam (MOBIC) 15 MG tablet Take 15 mg by mouth daily as needed for pain.   . metFORMIN (GLUCOPHAGE-XR) 500 MG 24 hr tablet Take 1 tablet by mouth daily.  Marland Kitchen OVER THE COUNTER MEDICATION Apply 2 drops to eye as needed.  . rosuvastatin (CRESTOR) 5 MG tablet Take 1 tablet by mouth daily.   Past Medical History:  Diagnosis Date  . Adenomatous colon polyp   . Allergy   . Anxiety   . Aortic atherosclerosis ([place]) 2017   on CT scan  . Benign fundic gland polyps of stomach 05/06/2013  .  Clostridium difficile infection 04/2015  . DDD (degenerative disc disease), lumbar   . Depression   . 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 Ocie Cornfield in 10/2010. [facility])  . Dyspnea    with exertion   . GERD (gastroesophageal reflux disease)   . Hepatic steatosis 2017   on CT scan  . History of hiatal hernia   . History of kidney stones   . History of SCC (squamous cell carcinoma) of skin   . Hyperlipidemia   . Hypothyroid   . IBS (irritable bowel syndrome)   . IFG (impaired fasting glucose)   . Narcotic abuse ([place])    distant history of muscle relaxant, sleeping pill  . Personal history of colonic polyp-Sessile serrated polyp  04/30/2013  . Pneumonia    hx of in 80s  . Seasonal allergic rhinitis   . Small fiber polyneuropathy    symmetric, chronic pain   Past Surgical History:  Procedure Laterality Date  . CHOLECYSTECTOMY    . COLONOSCOPY    . COLONOSCOPY W/ BIOPSIES    . ESOPHAGEAL MANOMETRY N/A 03/13/2017   Procedure: ESOPHAGEAL MANOMETRY (EM);  Surgeon: Mauri Pole, MD;  Location: [facility];  Service: Endoscopy;  Laterality: N/A;  . ESOPHAGOGASTRODUODENOSCOPY (EGD) WITH ESOPHAGEAL DILATION  02/2017  . LAPAROSCOPIC ENDOMETRIOSIS FULGURATION  1980's   patient denies at preop on 05/03/2017   . LAPAROSCOPIC NISSEN FUNDOPLICATION N/A [id number]   Procedure: LAPAROSCOPIC NISSEN FUNDOPLICATION;  Surgeon: Johnathan Hausen, MD;  Location: [facility];  Service: General;  Laterality: N/A;  . lipoma removal  2011  . UPPER GASTROINTESTINAL ENDOSCOPY    . Wide excision of dermatofibrosarcoma of right flank  10/2010   by a Dr. Ocie Cornfield in [facility].    Social History   Social History Narrative   She lives alone and has two grown children.   She works part-time in [employer] (medical).   Highest level of education:  College degree   family history includes Alcoholism in her mother; Breast cancer in her paternal aunt; Colon cancer in her paternal aunt; Coronary artery disease in her father; Dementia in her mother; Heart attack (age of onset: 26) in her father; Hyperlipidemia in her father and sister; Hypertension in her father; Hypothyroidism in her mother; Stroke in her father.   Review of Systems As above  Objective:   Physical Exam BP 124/62   Pulse 88   Temp 98.4 F (36.9 C)   Ht 5\' 4"  (1.626 m)   Wt 168 lb 2 oz (76.3 kg)   BMI 28.86 kg/m  No acute distress No CVA tenderness or spinal tenderness or back tenderness  The right anterior lower ribs are tender and so is the right upper quadrant which intensifies and worsens with abdominal wall tension through straight leg raise.  There is no hernia.

## 2019-02-12 NOTE — Patient Instructions (Addendum)
You have an abdominal wall strain.  I am going to get you an appointment with Dr. Bryan Lemma to inject it  (if he thinks that makes sense - I believe he will).  In the meantime avoid bending and twisting too much.  You can try voltaren gel which is over the counter and apply 4 times a day  I appreciate the opportunity to care for you. Connie Mayer, MD, [facility]

## 2019-02-13 ENCOUNTER — Telehealth: Payer: Self-pay

## 2019-02-13 NOTE — Telephone Encounter (Signed)
Yes, ok to set up appointment with me to discuss first then can decide on appropriate treatment plan. Thanks.

## 2019-02-13 NOTE — Telephone Encounter (Signed)
Pt returned your call. Pls call her again. She stated that she was driving when you first called.

## 2019-02-13 NOTE — Telephone Encounter (Signed)
-----   Message from [place], DO sent at 02/13/2019 12:19 PM EST ----- Regarding: FW: Abdominal wall injection Can you set up an OV appt for this patient with me in the next week or so for probable abdominal wall injection with Kenalog/Lidocaine? Thanks.  ----- Message ----- From: Gatha Mayer, MD Sent: 02/12/2019   5:13 PM EST To: Lavena Bullion, DO Subject: Abdominal wall injection                       Any time to do this soon?  Thanks  Glendell Docker

## 2019-02-13 NOTE — Telephone Encounter (Signed)
Called and spoke with patient-patient is hesitant on setting up this appt-wants more information -either send information through [facility] or call and speak with patient as she "does not think that injecting steroids directly into my rib cage/abd area is a good thing to help with the pain"- Make an appt to talk about this or inform patient via MyChart or phone?  Please advise

## 2019-02-13 NOTE — Telephone Encounter (Signed)
Left message for patient to call back to the office;  

## 2019-02-14 ENCOUNTER — Telehealth: Payer: Self-pay

## 2019-02-14 NOTE — Telephone Encounter (Signed)
Left message for patient to call back to the office;  

## 2019-02-14 NOTE — Telephone Encounter (Signed)
Spoke to patient to schedule appointment for trigger point injection. Patient was given information regarding the procedure and what to expect. All questions answered,appointment made for 03/01/19.

## 2019-02-14 NOTE — Telephone Encounter (Signed)
Patient returned call to the office-patient has agreed to make an appt to discuss the trigger point abd wall inj-has been scheduled on 02/18/2019 at 3:00 pm at the [facility] office; patient is aware of location of appt;  Patient advised to call back to the office at [phone] should questions/concerns arise;  Patient verbalized understanding of information/instructions;

## 2019-02-16 ENCOUNTER — Ambulatory Visit: Payer: PPO | Attending: Internal Medicine

## 2019-02-16 DIAGNOSIS — Z23 Encounter for immunization: Secondary | ICD-10-CM | POA: Insufficient documentation

## 2019-02-16 NOTE — Progress Notes (Signed)
   Covid-19 Vaccination Clinic  Name:  Talyssa Garbers    MRN: [id number] DOB: Feb 05, 1955  02/16/2019  Ms. Trumm was observed post Covid-19 immunization for 15 minutes without incidence. She was provided with Vaccine Information Sheet and instruction to access the V-Safe system.   Ms. Burnor was instructed to call 911 with any severe reactions post vaccine: Marland Kitchen Difficulty breathing  . Swelling of your face and throat  . A fast heartbeat  . A bad rash all over your body  . Dizziness and weakness    Immunizations Administered    Name Date Dose VIS Date Route   Pfizer COVID-19 Vaccine 02/16/2019  1:00 PM 0.3 mL 12/21/2018 Intramuscular   Manufacturer: Rafael Hernandez   Lot: [id number]   [place]: [id number]

## 2019-02-18 ENCOUNTER — Ambulatory Visit: Payer: PPO | Admitting: Gastroenterology

## 2019-02-24 ENCOUNTER — Ambulatory Visit: Payer: PPO

## 2019-03-01 ENCOUNTER — Ambulatory Visit: Payer: PPO | Admitting: Gastroenterology

## 2019-03-13 ENCOUNTER — Ambulatory Visit: Payer: PPO | Attending: Internal Medicine

## 2019-03-13 DIAGNOSIS — Z23 Encounter for immunization: Secondary | ICD-10-CM | POA: Insufficient documentation

## 2019-03-13 NOTE — Progress Notes (Signed)
   Covid-19 Vaccination Clinic  Name:  Connie Dillon    MRN: [id number] DOB: 1955-12-14  03/13/2019  [name] was observed post Covid-19 immunization for 15 minutes without incident. She was provided with Vaccine Information Sheet and instruction to access the V-Safe system.   [name] was instructed to call 911 with any severe reactions post vaccine: Marland Kitchen Difficulty breathing  . Swelling of face and throat  . A fast heartbeat  . A bad rash all over body  . Dizziness and weakness   Immunizations Administered    Name Date Dose VIS Date Route   Pfizer COVID-19 Vaccine 03/13/2019 11:04 AM 0.3 mL 12/21/2018 Intramuscular   Manufacturer: [place]   Lot: [id number]   [place]: [id number]

## 2019-03-25 DIAGNOSIS — R7301 Impaired fasting glucose: Secondary | ICD-10-CM | POA: Diagnosis not present

## 2019-03-25 DIAGNOSIS — G629 Polyneuropathy, unspecified: Secondary | ICD-10-CM | POA: Diagnosis not present

## 2019-03-25 DIAGNOSIS — F325 Major depressive disorder, single episode, in full remission: Secondary | ICD-10-CM | POA: Diagnosis not present

## 2019-03-25 DIAGNOSIS — M5136 Other intervertebral disc degeneration, lumbar region: Secondary | ICD-10-CM | POA: Diagnosis not present

## 2019-04-18 DIAGNOSIS — R635 Abnormal weight gain: Secondary | ICD-10-CM | POA: Diagnosis not present

## 2019-06-13 DIAGNOSIS — R7303 Prediabetes: Secondary | ICD-10-CM | POA: Diagnosis not present

## 2019-06-19 DIAGNOSIS — R519 Headache, unspecified: Secondary | ICD-10-CM | POA: Diagnosis not present

## 2019-07-03 DIAGNOSIS — J0100 Acute maxillary sinusitis, unspecified: Secondary | ICD-10-CM | POA: Diagnosis not present

## 2019-07-12 DIAGNOSIS — M25512 Pain in left shoulder: Secondary | ICD-10-CM | POA: Diagnosis not present

## 2019-07-24 DIAGNOSIS — M542 Cervicalgia: Secondary | ICD-10-CM | POA: Diagnosis not present

## 2019-07-24 DIAGNOSIS — M5412 Radiculopathy, cervical region: Secondary | ICD-10-CM | POA: Diagnosis not present

## 2019-07-24 DIAGNOSIS — M25512 Pain in left shoulder: Secondary | ICD-10-CM | POA: Diagnosis not present

## 2019-07-26 DIAGNOSIS — M7542 Impingement syndrome of left shoulder: Secondary | ICD-10-CM | POA: Diagnosis not present

## 2019-07-27 DIAGNOSIS — M542 Cervicalgia: Secondary | ICD-10-CM | POA: Diagnosis not present

## 2019-08-02 DIAGNOSIS — M7542 Impingement syndrome of left shoulder: Secondary | ICD-10-CM | POA: Diagnosis not present

## 2019-08-02 DIAGNOSIS — M75112 Incomplete rotator cuff tear or rupture of left shoulder, not specified as traumatic: Secondary | ICD-10-CM | POA: Diagnosis not present

## 2019-08-07 ENCOUNTER — Ambulatory Visit
Admission: RE | Admit: 2019-08-07 | Discharge: 2019-08-07 | Disposition: A | Discharge status: Home / Self Care | Payer: PPO | Source: Ambulatory Visit | Attending: Physician Assistant | Admitting: Physician Assistant

## 2019-08-07 ENCOUNTER — Other Ambulatory Visit: Payer: Self-pay

## 2019-08-07 ENCOUNTER — Other Ambulatory Visit: Payer: Self-pay | Admitting: Physician Assistant

## 2019-08-07 DIAGNOSIS — M79641 Pain in right hand: Secondary | ICD-10-CM

## 2019-08-07 DIAGNOSIS — M25531 Pain in right wrist: Secondary | ICD-10-CM | POA: Diagnosis not present

## 2019-09-10 ENCOUNTER — Ambulatory Visit: Payer: PPO | Attending: Internal Medicine

## 2019-09-10 DIAGNOSIS — Z23 Encounter for immunization: Secondary | ICD-10-CM

## 2019-09-10 NOTE — Progress Notes (Signed)
   Covid-19 Vaccination Clinic  Name:  Connie Dillon    MRN: [id number] DOB: [birth year]  09/10/2019  Connie Dillon was observed post Covid-19 immunization for 15 minutes without incident. She was provided with Vaccine Information Sheet and instruction to access the V-Safe system.   Connie Dillon was instructed to call 911 with any severe reactions post vaccine: Marland Kitchen Difficulty breathing  . Swelling of face and throat  . A fast heartbeat  . A bad rash all over body  . Dizziness and weakness

## 2019-09-13 DIAGNOSIS — M7542 Impingement syndrome of left shoulder: Secondary | ICD-10-CM | POA: Diagnosis not present

## 2019-09-13 DIAGNOSIS — M75112 Incomplete rotator cuff tear or rupture of left shoulder, not specified as traumatic: Secondary | ICD-10-CM | POA: Diagnosis not present

## 2019-09-19 DIAGNOSIS — R7301 Impaired fasting glucose: Secondary | ICD-10-CM | POA: Diagnosis not present

## 2019-09-19 DIAGNOSIS — Z1389 Encounter for screening for other disorder: Secondary | ICD-10-CM | POA: Diagnosis not present

## 2019-09-19 DIAGNOSIS — F331 Major depressive disorder, recurrent, moderate: Secondary | ICD-10-CM | POA: Diagnosis not present

## 2019-09-19 DIAGNOSIS — E039 Hypothyroidism, unspecified: Secondary | ICD-10-CM | POA: Diagnosis not present

## 2019-09-19 DIAGNOSIS — Z23 Encounter for immunization: Secondary | ICD-10-CM | POA: Diagnosis not present

## 2019-09-19 DIAGNOSIS — Z0000 Encounter for general adult medical examination without abnormal findings: Secondary | ICD-10-CM | POA: Diagnosis not present

## 2019-09-19 DIAGNOSIS — Z5181 Encounter for therapeutic drug level monitoring: Secondary | ICD-10-CM | POA: Diagnosis not present

## 2019-09-19 DIAGNOSIS — G629 Polyneuropathy, unspecified: Secondary | ICD-10-CM | POA: Diagnosis not present

## 2019-09-19 DIAGNOSIS — E7800 Pure hypercholesterolemia, unspecified: Secondary | ICD-10-CM | POA: Diagnosis not present

## 2019-09-19 DIAGNOSIS — I700 Atherosclerosis of aorta: Secondary | ICD-10-CM | POA: Diagnosis not present

## 2019-09-19 DIAGNOSIS — Z79899 Other long term (current) drug therapy: Secondary | ICD-10-CM | POA: Diagnosis not present

## 2019-09-24 DIAGNOSIS — M25512 Pain in left shoulder: Secondary | ICD-10-CM | POA: Diagnosis not present

## 2019-09-24 DIAGNOSIS — M75112 Incomplete rotator cuff tear or rupture of left shoulder, not specified as traumatic: Secondary | ICD-10-CM | POA: Diagnosis not present

## 2019-10-28 DIAGNOSIS — E039 Hypothyroidism, unspecified: Secondary | ICD-10-CM | POA: Diagnosis not present

## 2019-10-28 DIAGNOSIS — F331 Major depressive disorder, recurrent, moderate: Secondary | ICD-10-CM | POA: Diagnosis not present

## 2019-10-28 DIAGNOSIS — E7800 Pure hypercholesterolemia, unspecified: Secondary | ICD-10-CM | POA: Diagnosis not present

## 2019-11-12 DIAGNOSIS — H9113 Presbycusis, bilateral: Secondary | ICD-10-CM | POA: Diagnosis not present

## 2019-11-12 DIAGNOSIS — H903 Sensorineural hearing loss, bilateral: Secondary | ICD-10-CM | POA: Diagnosis not present

## 2019-12-10 DIAGNOSIS — F325 Major depressive disorder, single episode, in full remission: Secondary | ICD-10-CM | POA: Diagnosis not present

## 2019-12-10 DIAGNOSIS — F331 Major depressive disorder, recurrent, moderate: Secondary | ICD-10-CM | POA: Diagnosis not present

## 2019-12-10 DIAGNOSIS — E7800 Pure hypercholesterolemia, unspecified: Secondary | ICD-10-CM | POA: Diagnosis not present

## 2019-12-10 DIAGNOSIS — K219 Gastro-esophageal reflux disease without esophagitis: Secondary | ICD-10-CM | POA: Diagnosis not present

## 2019-12-10 DIAGNOSIS — E039 Hypothyroidism, unspecified: Secondary | ICD-10-CM | POA: Diagnosis not present

## 2019-12-25 DIAGNOSIS — F325 Major depressive disorder, single episode, in full remission: Secondary | ICD-10-CM | POA: Diagnosis not present

## 2019-12-25 DIAGNOSIS — E7800 Pure hypercholesterolemia, unspecified: Secondary | ICD-10-CM | POA: Diagnosis not present

## 2019-12-25 DIAGNOSIS — F419 Anxiety disorder, unspecified: Secondary | ICD-10-CM | POA: Diagnosis not present

## 2019-12-25 DIAGNOSIS — E039 Hypothyroidism, unspecified: Secondary | ICD-10-CM | POA: Diagnosis not present

## 2019-12-27 DIAGNOSIS — J019 Acute sinusitis, unspecified: Secondary | ICD-10-CM | POA: Diagnosis not present

## 2020-01-07 DIAGNOSIS — F325 Major depressive disorder, single episode, in full remission: Secondary | ICD-10-CM | POA: Diagnosis not present

## 2020-01-07 DIAGNOSIS — F331 Major depressive disorder, recurrent, moderate: Secondary | ICD-10-CM | POA: Diagnosis not present

## 2020-01-07 DIAGNOSIS — E039 Hypothyroidism, unspecified: Secondary | ICD-10-CM | POA: Diagnosis not present

## 2020-01-07 DIAGNOSIS — K219 Gastro-esophageal reflux disease without esophagitis: Secondary | ICD-10-CM | POA: Diagnosis not present

## 2020-01-07 DIAGNOSIS — E7800 Pure hypercholesterolemia, unspecified: Secondary | ICD-10-CM | POA: Diagnosis not present

## 2020-01-08 DIAGNOSIS — Z20828 Contact with and (suspected) exposure to other viral communicable diseases: Secondary | ICD-10-CM | POA: Diagnosis not present

## 2020-01-19 DIAGNOSIS — Z1152 Encounter for screening for COVID-19: Secondary | ICD-10-CM | POA: Diagnosis not present

## 2020-01-23 DIAGNOSIS — E7800 Pure hypercholesterolemia, unspecified: Secondary | ICD-10-CM | POA: Diagnosis not present

## 2020-01-23 DIAGNOSIS — F331 Major depressive disorder, recurrent, moderate: Secondary | ICD-10-CM | POA: Diagnosis not present

## 2020-01-23 DIAGNOSIS — E039 Hypothyroidism, unspecified: Secondary | ICD-10-CM | POA: Diagnosis not present

## 2020-01-23 DIAGNOSIS — K219 Gastro-esophageal reflux disease without esophagitis: Secondary | ICD-10-CM | POA: Diagnosis not present

## 2020-02-04 DIAGNOSIS — H5203 Hypermetropia, bilateral: Secondary | ICD-10-CM | POA: Diagnosis not present

## 2020-02-12 ENCOUNTER — Other Ambulatory Visit: Payer: Self-pay | Admitting: Physical Medicine and Rehabilitation

## 2020-02-12 DIAGNOSIS — M75101 Unspecified rotator cuff tear or rupture of right shoulder, not specified as traumatic: Secondary | ICD-10-CM

## 2020-02-12 DIAGNOSIS — M25511 Pain in right shoulder: Secondary | ICD-10-CM

## 2020-02-20 DIAGNOSIS — E7800 Pure hypercholesterolemia, unspecified: Secondary | ICD-10-CM | POA: Diagnosis not present

## 2020-02-20 DIAGNOSIS — E039 Hypothyroidism, unspecified: Secondary | ICD-10-CM | POA: Diagnosis not present

## 2020-02-20 DIAGNOSIS — K219 Gastro-esophageal reflux disease without esophagitis: Secondary | ICD-10-CM | POA: Diagnosis not present

## 2020-02-20 DIAGNOSIS — F331 Major depressive disorder, recurrent, moderate: Secondary | ICD-10-CM | POA: Diagnosis not present

## 2020-02-21 DIAGNOSIS — H524 Presbyopia: Secondary | ICD-10-CM | POA: Diagnosis not present

## 2020-03-06 ENCOUNTER — Other Ambulatory Visit: Payer: Self-pay

## 2020-03-16 DIAGNOSIS — E7800 Pure hypercholesterolemia, unspecified: Secondary | ICD-10-CM | POA: Diagnosis not present

## 2020-03-16 DIAGNOSIS — F325 Major depressive disorder, single episode, in full remission: Secondary | ICD-10-CM | POA: Diagnosis not present

## 2020-03-16 DIAGNOSIS — G629 Polyneuropathy, unspecified: Secondary | ICD-10-CM | POA: Diagnosis not present

## 2020-03-16 DIAGNOSIS — I700 Atherosclerosis of aorta: Secondary | ICD-10-CM | POA: Diagnosis not present

## 2020-03-22 ENCOUNTER — Other Ambulatory Visit: Payer: Self-pay

## 2020-03-22 ENCOUNTER — Ambulatory Visit
Admission: RE | Admit: 2020-03-22 | Discharge: 2020-03-22 | Disposition: A | Discharge status: Home / Self Care | Payer: Medicare Other | Source: Ambulatory Visit | Attending: Physical Medicine and Rehabilitation | Admitting: Physical Medicine and Rehabilitation

## 2020-03-22 DIAGNOSIS — M75101 Unspecified rotator cuff tear or rupture of right shoulder, not specified as traumatic: Secondary | ICD-10-CM

## 2020-03-22 DIAGNOSIS — M25511 Pain in right shoulder: Secondary | ICD-10-CM

## 2020-03-23 ENCOUNTER — Ambulatory Visit
Admission: RE | Admit: 2020-03-23 | Discharge: 2020-03-23 | Disposition: A | Discharge status: Home / Self Care | Payer: Medicare Other | Source: Ambulatory Visit | Attending: Physical Medicine and Rehabilitation | Admitting: Physical Medicine and Rehabilitation

## 2020-03-23 DIAGNOSIS — M25511 Pain in right shoulder: Secondary | ICD-10-CM | POA: Diagnosis not present

## 2020-03-25 DIAGNOSIS — M5416 Radiculopathy, lumbar region: Secondary | ICD-10-CM | POA: Diagnosis not present

## 2020-03-26 DIAGNOSIS — E039 Hypothyroidism, unspecified: Secondary | ICD-10-CM | POA: Diagnosis not present

## 2020-03-26 DIAGNOSIS — F331 Major depressive disorder, recurrent, moderate: Secondary | ICD-10-CM | POA: Diagnosis not present

## 2020-03-26 DIAGNOSIS — E7800 Pure hypercholesterolemia, unspecified: Secondary | ICD-10-CM | POA: Diagnosis not present

## 2020-03-26 DIAGNOSIS — F325 Major depressive disorder, single episode, in full remission: Secondary | ICD-10-CM | POA: Diagnosis not present

## 2020-03-27 DIAGNOSIS — R310 Gross hematuria: Secondary | ICD-10-CM | POA: Diagnosis not present

## 2020-03-27 DIAGNOSIS — R300 Dysuria: Secondary | ICD-10-CM | POA: Diagnosis not present

## 2020-03-31 ENCOUNTER — Encounter: Payer: Self-pay | Admitting: Internal Medicine

## 2020-03-31 ENCOUNTER — Ambulatory Visit: Payer: Medicare Other | Admitting: Internal Medicine

## 2020-03-31 ENCOUNTER — Other Ambulatory Visit: Payer: Self-pay

## 2020-03-31 VITALS — BP 116/78 | HR 96 | Ht 64.0 in | Wt 159.0 lb

## 2020-03-31 DIAGNOSIS — R198 Other specified symptoms and signs involving the digestive system and abdomen: Secondary | ICD-10-CM | POA: Diagnosis not present

## 2020-03-31 DIAGNOSIS — K648 Other hemorrhoids: Secondary | ICD-10-CM

## 2020-03-31 DIAGNOSIS — R3915 Urgency of urination: Secondary | ICD-10-CM

## 2020-03-31 DIAGNOSIS — N393 Stress incontinence (female) (male): Secondary | ICD-10-CM

## 2020-03-31 DIAGNOSIS — R150 Incomplete defecation: Secondary | ICD-10-CM | POA: Diagnosis not present

## 2020-03-31 DIAGNOSIS — N819 Female genital prolapse, unspecified: Secondary | ICD-10-CM

## 2020-03-31 DIAGNOSIS — L290 Pruritus ani: Secondary | ICD-10-CM | POA: Diagnosis not present

## 2020-03-31 MED ORDER — HYDROCORTISONE (PERIANAL) 2.5 % EX CREA
1.0000 | TOPICAL_CREAM | Freq: Every day | CUTANEOUS | 1 refills | Status: DC
Start: 2020-03-31 — End: 2020-10-14

## 2020-03-31 MED ORDER — LIDOCAINE (ANORECTAL) 5 % EX CREA: 1.0000 "application " | TOPICAL_CREAM | CUTANEOUS | 0 refills | Status: DC | PRN

## 2020-03-31 NOTE — Patient Instructions (Signed)
Use 1-2 tablespoons of benefiber daily, handout provided.  Use the anusol cream nightly for 2 weeks.  Try over the counter recticare as needed for the rectal itching.  We are placing a referral to Dr Sherlene Shams. She is a Writer. They will contact you about setting up the appointment.  Try pristine toilet paper spray or Qlease. You can get these off of [facility].  I appreciate the opportunity to care for you. Silvano Rusk, MD, [facility]

## 2020-03-31 NOTE — Progress Notes (Signed)
Connie Dillon 65 y.o. 05/01/1955 [id number]  Assessment & Plan:   Encounter Diagnoses  Name Primary?  . Pruritus ani Yes  . Internal and external prolapsed hemorrhoids   . Irregular bowel habits   . Incomplete defecation   . Female genital prolapse?   Marland Kitchen Urinary urgency   . SUI (stress urinary incontinence, female)    Treat hemorrhoids and pruritus ani hemorrhoids and pruritus a night with 2.5% hydrocortisone cream, 5% topical lidocaine.  Ligation of hemorrhoids is an option but there are other issues ongoing and I will defer that for now, it may not be necessary.  1 to 2 tablespoons Benefiber daily to try to promote more regular defecation.  Toilet paper spray such as pristine toilet paper spray or Qleanse to aid with anorectal hygiene  Urogynecology evaluation given urinary symptoms and question of female genital prolapse since I suspect cervix indenting anterior rectal wall with simulated defecation on rectal exam.  She is congratulated on weight loss which has resulted in discontinuing Metformin  Return to see me 6 to 8 weeks  I appreciate the opportunity to care for this patient. CC: [name], MD Dr. Sherlene Shams  Subjective:   Chief Complaint: Anal itching and hemorrhoids  HPI [name] is a 65 year old white woman known to me from prior colonoscopy (see below) who was complaining of intense anal itching off and on and what she thinks are symptomatic hemorrhoids.  She suffers with incomplete defecation symptoms frequently.  She used to take Metformin and had multiple easy bowel movements but she has been exercising and changing her eating habits and has lost enough weight to improve her diabetes control.  She did not need medication.  Since then she has had more irregular defecation she often moves her bowels 5 times in a day and frequently after every meal which is bothersome.  Some days she will just have little small stools and skip a day as well.  Goes to  the bathroom to defecate most times.  Quite urgent after defecation.  This is bothering her.  Stool consistency [place] stool 2-3. She also has a history of stress urinary incontinence.  2 vaginal deliveries without birth trauma known.  Long history of dyspareunia.  Not sexually active now however.    Colonoscopy 2/32020 - Two diminutive polyps in the transverse colon, removed with a cold biopsy forceps. Resected and retrieved. - Diverticulosis in the entire examined colon. - Internal hemorrhoids. - The examination was otherwise normal on direct and retroflexion views.   Allergies  Allergen Reactions  . Cefdinir Other (See Comments)    C-Diff  . Clindamycin/Lincomycin Other (See Comments)    C-Diff, dehydration  . Dicyclomine Hcl     Insomnia and irritability  . Doxycycline Other (See Comments)    GERD  . Erythrocin [Erythromycin] Hives  . Metformin And Related Diarrhea  . Other Other (See Comments)    All Mycins  . Peanuts [Peanut Oil] Hives and Other (See Comments)    ALL NUTS.    . Statins Other (See Comments)    Gain weight/muscle pains  . Zicam Cold Remedy [Homeopathic Products] Hives and Itching  . Amoxicillin Hives and Rash  . Latex Rash  . Sudafed [Pseudoephedrine Hcl] Palpitations and Other (See Comments)    hyper  . Sulfa Antibiotics Hives and Rash   Current Meds  Medication Sig  . buPROPion (WELLBUTRIN XL) 300 MG 24 hr tablet Take 300 mg by mouth every morning.  . chlorpheniramine (CHLOR-TRIMETON) 4  MG tablet Take 4 mg by mouth 2 (two) times daily as needed for allergies.  . cholecalciferol (VITAMIN D) 25 MCG (1000 UNIT) tablet Take 1 tablet by mouth as needed.  . Cyanocobalamin (VITAMIN B12) 1000 MCG TBCR Take 1 tablet by mouth as needed.  . diclofenac sodium (VOLTAREN) 1 % GEL Place 2 g onto the skin 4 (four) times daily as needed.  . DULoxetine (CYMBALTA) 60 MG capsule Take 60 mg by mouth daily.  Marland Kitchen gabapentin (NEURONTIN) 100 MG capsule Take 100-200 mg by  mouth daily.  Marland Kitchen HYDROcodone-acetaminophen (NORCO/VICODIN) 5-325 MG tablet Take 1 tablet by mouth as needed.  Marland Kitchen ibuprofen (ADVIL,MOTRIN) 200 MG tablet Take 400 mg by mouth every 6 (six) hours as needed for headache or moderate pain.   Marland Kitchen levothyroxine (SYNTHROID, LEVOTHROID) 50 MCG tablet Take 1 tablet (50 mcg total) by mouth daily.  Marland Kitchen OVER THE COUNTER MEDICATION Apply 2 drops to eye as needed.  Marland Kitchen REPATHA SURECLICK 332 MG/ML SOAJ Inject 1 mL into the skin every 14 (fourteen) days.   Past Medical History:  Diagnosis Date  . Adenomatous colon polyp   . Allergy   . Anxiety   . Aortic atherosclerosis ([place]) 2017   on CT scan  . Benign fundic gland polyps of stomach 05/06/2013  . Clostridium difficile infection 04/2015  . DDD (degenerative disc disease), lumbar   . Depression   . 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 Ocie Cornfield in 10/2010. [facility])  . Dyspnea    with exertion   . GERD (gastroesophageal reflux disease)   . Hepatic steatosis 2017   on CT scan  . History of hiatal hernia   . History of kidney stones   . History of SCC (squamous cell carcinoma) of skin   . Hyperlipidemia   . Hypothyroid   . IBS (irritable bowel syndrome)   . IFG (impaired fasting glucose)   . Narcotic abuse ([place])    distant history of muscle relaxant, sleeping pill  . Personal history of colonic polyp-Sessile serrated polyp 04/30/2013  . Pneumonia    hx of in 80s  . Seasonal allergic rhinitis   . Small fiber polyneuropathy    symmetric, chronic pain   Past Surgical History:  Procedure Laterality Date  . CHOLECYSTECTOMY    . COLONOSCOPY    . COLONOSCOPY W/ BIOPSIES    . ESOPHAGEAL MANOMETRY N/A 03/13/2017   Procedure: ESOPHAGEAL MANOMETRY (EM);  Surgeon: Mauri Pole, MD;  Location: [facility];  Service: Endoscopy;  Laterality: N/A;  . ESOPHAGOGASTRODUODENOSCOPY (EGD) WITH ESOPHAGEAL DILATION  02/2017  . LAPAROSCOPIC ENDOMETRIOSIS  FULGURATION  1980's   patient denies at preop on 05/03/2017   . LAPAROSCOPIC NISSEN FUNDOPLICATION N/A 09/15/1882   Procedure: LAPAROSCOPIC NISSEN FUNDOPLICATION;  Surgeon: Johnathan Hausen, MD;  Location: [facility];  Service: General;  Laterality: N/A;  . lipoma removal  2011  . UPPER GASTROINTESTINAL ENDOSCOPY    . Wide excision of dermatofibrosarcoma of right flank  10/2010   by a Dr. Ocie Cornfield in [facility].    Social History   Social History Narrative   She lives alone and has two grown children.   She works part-time in [employer] (medical).   Highest level of education:  College degree   family history includes Alcoholism in her mother; Breast cancer in her paternal aunt; Colon cancer in her paternal aunt; Coronary artery disease in her father; Dementia in her mother; Heart attack (age of onset: 90) in her  father; Hyperlipidemia in her father and sister; Hypertension in her father; Hypothyroidism in her mother; Stroke in her father.   Review of Systems   Objective:   Physical Exam BP 116/78 (BP Location: Left Arm, Patient Position: Sitting, Cuff Size: Normal)   Pulse 96   Ht 5\' 4"  (1.626 m) Comment: height measured without shoes  Wt 159 lb (72.1 kg)   BMI 27.29 kg/m    Patti Martinique, CMA present.   Anoderm inspection revealed ext tags/hems no rash Anal wink was absent Digital exam revealed normal resting tone and voluntary squeeze. No mass or rectocele present. Simulated defecation with valsalva revealed appropriate abdominal contraction, but cervix pressed into anterior rectal wall and reduced rectal descent

## 2020-04-15 DIAGNOSIS — R10814 Left lower quadrant abdominal tenderness: Secondary | ICD-10-CM | POA: Diagnosis not present

## 2020-04-15 DIAGNOSIS — R300 Dysuria: Secondary | ICD-10-CM | POA: Diagnosis not present

## 2020-04-15 DIAGNOSIS — R10819 Abdominal tenderness, unspecified site: Secondary | ICD-10-CM | POA: Diagnosis not present

## 2020-04-16 ENCOUNTER — Telehealth: Payer: Self-pay | Admitting: Internal Medicine

## 2020-04-16 NOTE — Telephone Encounter (Signed)
Patient called and is seeking help with the recent referral to Dr. Tommas Olp office. Patient states her condition is getting worst and has already seen her PCP.

## 2020-04-16 NOTE — Telephone Encounter (Signed)
Precious Bard, I believe you are working on this.  Can you help her please

## 2020-04-16 NOTE — Telephone Encounter (Signed)
They have made her an appointment with Dr Sherlene Shams for 05/18/2020. She saw Dr Laurann Montana yesterday and they are running a urine culture. The rectal itching is better she reports. She is on the cancellation list and I told her to call them every day to see if anyone cancels. She is asking is there any other testing that she needs to do while she waits on this appointment. She is nervous she said because she had skin cancer years ago.

## 2020-04-17 NOTE — Telephone Encounter (Signed)
I called Connie Dillon and told her what Dr Carlean Purl said. She has called her PCP office twice today and is waiting on a call back about her urine culture. She is peeing more blood today and she is worried. She said they do their calls at the end of day so hopefully she will hear from them.

## 2020-04-17 NOTE — Telephone Encounter (Signed)
Pt states that she will not be able to see Dr. Wannetta Sender until May, she states that her sxs are worsening so is wondering if she can be referred to a different doctor who can see her sooner. Pls call her.

## 2020-04-17 NOTE — Telephone Encounter (Signed)
Please tell her that I do not suspect any type of cancer

## 2020-05-05 DIAGNOSIS — F325 Major depressive disorder, single episode, in full remission: Secondary | ICD-10-CM | POA: Diagnosis not present

## 2020-05-05 DIAGNOSIS — F331 Major depressive disorder, recurrent, moderate: Secondary | ICD-10-CM | POA: Diagnosis not present

## 2020-05-05 DIAGNOSIS — E7800 Pure hypercholesterolemia, unspecified: Secondary | ICD-10-CM | POA: Diagnosis not present

## 2020-05-05 DIAGNOSIS — E039 Hypothyroidism, unspecified: Secondary | ICD-10-CM | POA: Diagnosis not present

## 2020-05-14 ENCOUNTER — Telehealth: Payer: Self-pay

## 2020-05-14 NOTE — Telephone Encounter (Signed)
Connie Dillon is a 65 y.o. female was called and contacted re: New pt Pre appt call to collect history information. -Allergy -Medication -Confirm pharmacy -OB history   Pt was available.Chart was updated. Pt was notified to arrive 15 min early and we will need a urine sample when she arrives. Pt verbalized understanding.

## 2020-05-15 DIAGNOSIS — H903 Sensorineural hearing loss, bilateral: Secondary | ICD-10-CM | POA: Diagnosis not present

## 2020-05-18 ENCOUNTER — Ambulatory Visit: Payer: Medicare Other | Admitting: Obstetrics and Gynecology

## 2020-05-22 ENCOUNTER — Ambulatory Visit: Payer: Medicare Other | Admitting: Internal Medicine

## 2020-05-22 DIAGNOSIS — M7632 Iliotibial band syndrome, left leg: Secondary | ICD-10-CM | POA: Diagnosis not present

## 2020-05-25 ENCOUNTER — Ambulatory Visit: Payer: Medicare Other | Admitting: Obstetrics and Gynecology

## 2020-05-27 DIAGNOSIS — M25552 Pain in left hip: Secondary | ICD-10-CM | POA: Diagnosis not present

## 2020-05-27 DIAGNOSIS — M25562 Pain in left knee: Secondary | ICD-10-CM | POA: Diagnosis not present

## 2020-06-11 DIAGNOSIS — M5416 Radiculopathy, lumbar region: Secondary | ICD-10-CM | POA: Diagnosis not present

## 2020-06-26 ENCOUNTER — Ambulatory Visit
Admission: RE | Admit: 2020-06-26 | Discharge: 2020-06-26 | Disposition: A | Discharge status: Home / Self Care | Payer: Medicare Other | Source: Ambulatory Visit | Attending: Internal Medicine | Admitting: Internal Medicine

## 2020-06-26 ENCOUNTER — Other Ambulatory Visit: Payer: Self-pay | Admitting: Internal Medicine

## 2020-06-26 DIAGNOSIS — R0781 Pleurodynia: Secondary | ICD-10-CM | POA: Diagnosis not present

## 2020-06-26 DIAGNOSIS — R0789 Other chest pain: Secondary | ICD-10-CM | POA: Diagnosis not present

## 2020-06-30 DIAGNOSIS — F325 Major depressive disorder, single episode, in full remission: Secondary | ICD-10-CM | POA: Diagnosis not present

## 2020-06-30 DIAGNOSIS — F331 Major depressive disorder, recurrent, moderate: Secondary | ICD-10-CM | POA: Diagnosis not present

## 2020-06-30 DIAGNOSIS — E7800 Pure hypercholesterolemia, unspecified: Secondary | ICD-10-CM | POA: Diagnosis not present

## 2020-06-30 DIAGNOSIS — E039 Hypothyroidism, unspecified: Secondary | ICD-10-CM | POA: Diagnosis not present

## 2020-07-01 DIAGNOSIS — M25552 Pain in left hip: Secondary | ICD-10-CM | POA: Diagnosis not present

## 2020-07-08 ENCOUNTER — Other Ambulatory Visit: Payer: Self-pay | Admitting: Internal Medicine

## 2020-07-08 DIAGNOSIS — E2839 Other primary ovarian failure: Secondary | ICD-10-CM

## 2020-07-15 DIAGNOSIS — M25511 Pain in right shoulder: Secondary | ICD-10-CM | POA: Diagnosis not present

## 2020-07-15 DIAGNOSIS — M5412 Radiculopathy, cervical region: Secondary | ICD-10-CM | POA: Diagnosis not present

## 2020-07-23 DIAGNOSIS — M5412 Radiculopathy, cervical region: Secondary | ICD-10-CM | POA: Diagnosis not present

## 2020-07-29 DIAGNOSIS — G894 Chronic pain syndrome: Secondary | ICD-10-CM | POA: Diagnosis not present

## 2020-08-13 DIAGNOSIS — M5459 Other low back pain: Secondary | ICD-10-CM | POA: Diagnosis not present

## 2020-08-13 DIAGNOSIS — M542 Cervicalgia: Secondary | ICD-10-CM | POA: Diagnosis not present

## 2020-08-24 DIAGNOSIS — E7800 Pure hypercholesterolemia, unspecified: Secondary | ICD-10-CM | POA: Diagnosis not present

## 2020-08-24 DIAGNOSIS — E039 Hypothyroidism, unspecified: Secondary | ICD-10-CM | POA: Diagnosis not present

## 2020-08-24 DIAGNOSIS — K219 Gastro-esophageal reflux disease without esophagitis: Secondary | ICD-10-CM | POA: Diagnosis not present

## 2020-08-24 DIAGNOSIS — F325 Major depressive disorder, single episode, in full remission: Secondary | ICD-10-CM | POA: Diagnosis not present

## 2020-09-21 DIAGNOSIS — R7301 Impaired fasting glucose: Secondary | ICD-10-CM | POA: Diagnosis not present

## 2020-09-21 DIAGNOSIS — G629 Polyneuropathy, unspecified: Secondary | ICD-10-CM | POA: Diagnosis not present

## 2020-09-21 DIAGNOSIS — I700 Atherosclerosis of aorta: Secondary | ICD-10-CM | POA: Diagnosis not present

## 2020-09-21 DIAGNOSIS — E7800 Pure hypercholesterolemia, unspecified: Secondary | ICD-10-CM | POA: Diagnosis not present

## 2020-09-21 DIAGNOSIS — E039 Hypothyroidism, unspecified: Secondary | ICD-10-CM | POA: Diagnosis not present

## 2020-10-07 DIAGNOSIS — M47896 Other spondylosis, lumbar region: Secondary | ICD-10-CM | POA: Diagnosis not present

## 2020-10-07 DIAGNOSIS — M47816 Spondylosis without myelopathy or radiculopathy, lumbar region: Secondary | ICD-10-CM | POA: Diagnosis not present

## 2020-10-07 DIAGNOSIS — M533 Sacrococcygeal disorders, not elsewhere classified: Secondary | ICD-10-CM | POA: Diagnosis not present

## 2020-10-13 ENCOUNTER — Telehealth: Payer: Self-pay | Admitting: Internal Medicine

## 2020-10-13 NOTE — Telephone Encounter (Signed)
Patient reports that she is having urgent defecation about 10 to 12 times a day especially with walking or any activity.  Symptoms present for the last 10 days . She is usually just passing "jelly like mucus" and very little stool. The stool is solid.  She does report that she is having some mild abdominal cramping. She wants to travel to see her son later this month.  No other GI complaints.  No recent antibiotics, travel, etc.

## 2020-10-13 NOTE — Telephone Encounter (Signed)
Discussed with Dr. Carlean Purl, patient to come in tomorrow at 3:30.  She is aware

## 2020-10-14 ENCOUNTER — Other Ambulatory Visit (INDEPENDENT_AMBULATORY_CARE_PROVIDER_SITE_OTHER): Payer: Medicare Other

## 2020-10-14 ENCOUNTER — Ambulatory Visit: Payer: Medicare Other | Admitting: Internal Medicine

## 2020-10-14 ENCOUNTER — Encounter: Payer: Self-pay | Admitting: Internal Medicine

## 2020-10-14 VITALS — BP 104/60 | HR 96 | Ht 64.5 in | Wt 150.0 lb

## 2020-10-14 DIAGNOSIS — K648 Other hemorrhoids: Secondary | ICD-10-CM

## 2020-10-14 DIAGNOSIS — K589 Irritable bowel syndrome without diarrhea: Secondary | ICD-10-CM

## 2020-10-14 DIAGNOSIS — R195 Other fecal abnormalities: Secondary | ICD-10-CM

## 2020-10-14 DIAGNOSIS — R152 Fecal urgency: Secondary | ICD-10-CM

## 2020-10-14 DIAGNOSIS — R10813 Right lower quadrant abdominal tenderness: Secondary | ICD-10-CM

## 2020-10-14 DIAGNOSIS — R1031 Right lower quadrant pain: Secondary | ICD-10-CM

## 2020-10-14 LAB — CBC WITH DIFFERENTIAL/PLATELET
Basophils Absolute: 0.1 10*3/uL (ref 0.0–0.1)
Basophils Relative: 0.7 % (ref 0.0–3.0)
Eosinophils Absolute: 0.1 10*3/uL (ref 0.0–0.7)
Eosinophils Relative: 1.1 % (ref 0.0–5.0)
HCT: 44.9 % (ref 36.0–46.0)
Hemoglobin: 14.9 g/dL (ref 12.0–15.0)
Lymphocytes Relative: 19.9 % (ref 12.0–46.0)
Lymphs Abs: 2.3 10*3/uL (ref 0.7–4.0)
MCHC: 33.1 g/dL (ref 30.0–36.0)
MCV: 87.8 fl (ref 78.0–100.0)
Monocytes Absolute: 1.4 10*3/uL — ABNORMAL HIGH (ref 0.1–1.0)
Monocytes Relative: 12.2 % — ABNORMAL HIGH (ref 3.0–12.0)
Neutro Abs: 7.5 10*3/uL (ref 1.4–7.7)
Neutrophils Relative %: 66.1 % (ref 43.0–77.0)
Platelets: 384 10*3/uL (ref 150.0–400.0)
RBC: 5.11 Mil/uL (ref 3.87–5.11)
RDW: 12.6 % (ref 11.5–15.5)
WBC: 11.4 10*3/uL — ABNORMAL HIGH (ref 4.0–10.5)

## 2020-10-14 LAB — COMPREHENSIVE METABOLIC PANEL
ALT: 14 U/L (ref 0–35)
AST: 12 U/L (ref 0–37)
Albumin: 4.3 g/dL (ref 3.5–5.2)
Alkaline Phosphatase: 89 U/L (ref 39–117)
BUN: 15 mg/dL (ref 6–23)
CO2: 29 mEq/L (ref 19–32)
Calcium: 9 mg/dL (ref 8.4–10.5)
Chloride: 103 mEq/L (ref 96–112)
Creatinine, Ser: 0.89 mg/dL (ref 0.40–1.20)
GFR: 67.13 mL/min (ref >60.00)
Glucose, Bld: 87 mg/dL (ref 70–99)
Potassium: 3.7 mEq/L (ref 3.5–5.1)
Sodium: 140 mEq/L (ref 135–145)
Total Bilirubin: 0.4 mg/dL (ref 0.2–1.2)
Total Protein: 6.6 g/dL (ref 6.0–8.3)

## 2020-10-14 NOTE — Patient Instructions (Addendum)
Your provider has requested that you go to the basement level for lab work before leaving today. Press "B" on the elevator. The lab is located at the first door on the left as you exit the elevator.  Due to recent changes in healthcare laws, you may see the results of your imaging and laboratory studies on MyChart before your provider has had a chance to review them.  We understand that in some cases there may be results that are confusing or concerning to you. Not all laboratory results come back in the same time frame and the provider may be waiting for multiple results in order to interpret others.  Please give [place] 48 hours in order for your provider to thoroughly review all the results before contacting the office for clarification of your results.   Continue your Advil for pain.  You have been scheduled for a CT scan of the abdomen and pelvis at [facility] are scheduled on 10/23/20 at 10:30am. You should arrive 15 minutes prior to your appointment time for registration. Please follow the written instructions below on the day of your exam:  WARNING: IF YOU ARE ALLERGIC TO IODINE/X-RAY DYE, PLEASE NOTIFY RADIOLOGY IMMEDIATELY AT [phone]! YOU WILL BE GIVEN A 13 HOUR PREMEDICATION PREP.  1) Do not eat or drink anything after 6:30am (4 hours prior to your test) 2) You have been given 2 bottles of oral contrast to drink. The solution may taste better if refrigerated, but do NOT add ice or any other liquid to this solution. Shake well before drinking.    Drink 1 bottle of contrast @ 8:30am (2 hours prior to your exam)  Drink 1 bottle of contrast @ 9:30am (1 hour prior to your exam)  You may take any medications as prescribed with a small amount of water, if necessary. If you take any of the following medications: METFORMIN, GLUCOPHAGE, GLUCOVANCE, AVANDAMET, RIOMET, FORTAMET, [place] MET, JANUMET, GLUMETZA or METAGLIP, you MAY be asked to HOLD this medication 48 hours AFTER the  exam.  The purpose of you drinking the oral contrast is to aid in the visualization of your intestinal tract. The contrast solution may cause some diarrhea. Depending on your individual set of symptoms, you may also receive an intravenous injection of x-ray contrast/dye. Plan on being at [facility] for 30 minutes or longer, depending on the type of exam you are having performed.  This test typically takes 30-45 minutes to complete.  If you have any questions regarding your exam or if you need to reschedule, you may call the CT department at [phone] between the hours of 8:00 am and 5:00 pm, Monday-Friday.  I appreciate the opportunity to care for you. Silvano Rusk, MD, [facility]  ________________________________________________________________________

## 2020-10-14 NOTE — Progress Notes (Signed)
Connie Dillon 65 y.o. Oct 15, 1955 [id number]  Assessment & Plan:   Encounter Diagnoses  Name Primary?   Right lower quadrant abdominal tenderness without rebound tenderness Yes   Fecal urgency    Mucus in stool    Irritable bowel syndrome, unspecified type     I think this is most likely a flare of her IBS but diverticulitis or other inflammatory processes are in the differential.  I am going to order a CBC, c-Met and a CT abdomen and pelvis with contrast.  She has side effects from dicyclomine so Tylenol or Advil as needed for now.  Once we get these results we will have a better idea of where to go from here.  We had a brief discussion that ultimately I still think seeing the urogynecologist could be helpful but I am not sure she is going to follow through with that.  CC: [name], MD     Subjective:   Chief Complaint: Mucus in stools, fecal urgency and frequency and abdominal pain  HPI 65 year old woman known to me since 2015 with a history of IBS and gas bloat syndrome status post fundoplication who called the office yesterday stating that she had 10 mucoid stools, no diarrhea and was very concerned and had hoped to take a trip to see family (son) out of state later this month is worried she would not be able to go.  I worked her in to be seen.  She was last seen in the late winter or early spring and I had referred her to urogynecology for defecation issues, urinary issues and question of pelvic organ prolapse.  It sounds like she is really not interested in pursuing that.   She denies any change in eating she wondered if popcorn had triggered this, she stopped eating popcorn a week or so ago.  The changes started a week or so ago but have intensified of late.  No reported fever no vomiting reported.  Diffuse lower abdominal pain a lot better today.  She reports that she is able to sleep if she takes an Advil.  Today there is been a slight pink tinge to the mucus, she had  a photo on her phone that she showed me.  There is no frank bleeding.  Colonoscopy in February 2020 with 2 diminutive adenomas diverticulosis in the left colon and internal hemorrhoids. Allergies  Allergen Reactions   Cefdinir Other (See Comments)    C-Diff   Clindamycin/Lincomycin Other (See Comments)    C-Diff, dehydration   Dicyclomine Hcl     Insomnia and irritability   Doxycycline Other (See Comments)    GERD   Erythrocin [Erythromycin] Hives   Metformin And Related Diarrhea   Other Other (See Comments)    All Mycins   Peanuts [Peanut Oil] Hives and Other (See Comments)    ALL NUTS.     Statins Other (See Comments)    Gain weight/muscle pains   Zicam Cold Remedy [Homeopathic Products] Hives and Itching   Amoxicillin Hives and Rash   Latex Rash   Sudafed [Pseudoephedrine Hcl] Palpitations and Other (See Comments)    hyper   Sulfa Antibiotics Hives and Rash   Current Meds  Medication Sig   buPROPion (WELLBUTRIN XL) 300 MG 24 hr tablet Take 300 mg by mouth every morning.   chlorpheniramine (CHLOR-TRIMETON) 4 MG tablet Take 4 mg by mouth 2 (two) times daily as needed for allergies.   cholecalciferol (VITAMIN D) 25 MCG (1000 UNIT) tablet Take 1  tablet by mouth as needed.   Cyanocobalamin (VITAMIN B12) 1000 MCG TBCR Take 1 tablet by mouth as needed.   diclofenac sodium (VOLTAREN) 1 % GEL Place 2 g onto the skin 4 (four) times daily as needed.   DULoxetine (CYMBALTA) 60 MG capsule Take 60 mg by mouth daily.   gabapentin (NEURONTIN) 100 MG capsule Take 100-200 mg by mouth daily.   ibuprofen (ADVIL,MOTRIN) 200 MG tablet Take 400 mg by mouth every 6 (six) hours as needed for headache or moderate pain.    levothyroxine (SYNTHROID, LEVOTHROID) 50 MCG tablet Take 1 tablet (50 mcg total) by mouth daily.   OVER THE COUNTER MEDICATION Apply 2 drops to eye as needed.   REPATHA SURECLICK 161 MG/ML SOAJ Inject 1 mL into the skin every 14 (fourteen) days.   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 Ocie Cornfield in 10/2010. ([place])   Dyspnea    with exertion    GERD (gastroesophageal reflux disease)    Hepatic steatosis 2017   on CT scan   History of hiatal hernia    History of kidney stones    History of SCC (squamous cell carcinoma) of skin    Hyperlipidemia    Hypothyroid    IBS (irritable bowel syndrome)    IFG (impaired fasting glucose)    Narcotic abuse ([place])    distant history of muscle relaxant, sleeping pill   Personal history of colonic polyp-Sessile serrated polyp 04/30/2013   Pneumonia    hx of in 80s   Seasonal allergic rhinitis    Small fiber polyneuropathy    symmetric, chronic pain   Past Surgical History:  Procedure Laterality Date   CHOLECYSTECTOMY     COLONOSCOPY     COLONOSCOPY W/ BIOPSIES     ESOPHAGEAL MANOMETRY N/A 03/13/2017   Procedure: ESOPHAGEAL MANOMETRY (EM);  Surgeon: Mauri Pole, MD;  [facility];  Service: Endoscopy;  Laterality: N/A;   ESOPHAGOGASTRODUODENOSCOPY (EGD) WITH ESOPHAGEAL DILATION  02/2017   LAPAROSCOPIC ENDOMETRIOSIS FULGURATION  1980's   patient denies at preop on 05/03/2017    LAPAROSCOPIC NISSEN FUNDOPLICATION N/A 0/09/6043   Procedure: LAPAROSCOPIC NISSEN FUNDOPLICATION;  Surgeon: Johnathan Hausen, MD;  Location: [facility];  Service: General;  Laterality: N/A;   lipoma removal  2011   UPPER GASTROINTESTINAL ENDOSCOPY     Wide excision of dermatofibrosarcoma of right flank  10/2010   by a Dr. Ocie Cornfield in [facility].    Social History   Social History Narrative   She lives alone and has two grown children.   She works part-time in [employer] (medical).   Highest level of  education:  College degree   family history includes Alcoholism in her mother; Breast cancer in her paternal aunt; Colon cancer in her paternal aunt; Coronary artery disease in her father; Dementia in her mother; Heart attack (age of onset: 78) in her father; Hyperlipidemia in her father and sister; Hypertension in her father; Hypothyroidism in her mother; Stroke in her father.   Review of Systems As above  Objective:   Physical Exam BP 104/60   Pulse 96   Ht  5' 4.5" (1.638 m)   Wt 150 lb (68 kg)   BMI 25.35 kg/m  I will let you know pathology results and when to have another routine colonoscopy by mail and/or My Chart. Patti Martinique, [place] present. Abdominal exam mildly distended soft tender in the right lower quadrant without rebound.  There is a right lower quadrant surgical scar from prior removal of sarcoma.  Bowel sounds are present.  Mild left lower quadrant tenderness.  No masses palpated no obvious hernia.  Rectal exam shows a  normal anoderm.  She is hyperesthetic to insertion of the digit for exam.  No mass present no sign of a fissure.  No stool.  Anoscopy is performed and shows swollen edematous grade 2 internal hemorrhoids LL and RP most prominent.  There is a slight bit of mucus but the anal and rectal mucosa that I can see looks slightly erythematous but no ulceration or proctitis.

## 2020-10-16 DIAGNOSIS — D72829 Elevated white blood cell count, unspecified: Secondary | ICD-10-CM | POA: Diagnosis not present

## 2020-10-16 DIAGNOSIS — K5900 Constipation, unspecified: Secondary | ICD-10-CM | POA: Diagnosis not present

## 2020-10-20 ENCOUNTER — Other Ambulatory Visit: Payer: Self-pay

## 2020-10-20 ENCOUNTER — Ambulatory Visit (HOSPITAL_COMMUNITY)
Admission: RE | Admit: 2020-10-20 | Discharge: 2020-10-20 | Disposition: A | Discharge status: Home / Self Care | Payer: Medicare Other | Source: Ambulatory Visit | Attending: Internal Medicine | Admitting: Internal Medicine

## 2020-10-20 DIAGNOSIS — R10813 Right lower quadrant abdominal tenderness: Secondary | ICD-10-CM | POA: Diagnosis not present

## 2020-10-20 DIAGNOSIS — R1031 Right lower quadrant pain: Secondary | ICD-10-CM | POA: Insufficient documentation

## 2020-10-20 MED ORDER — IOHEXOL 350 MG/ML SOLN
80.0000 mL | Freq: Once | INTRAVENOUS | Status: AC | PRN
  Administered 2020-10-20: 80 mL via INTRAVENOUS

## 2020-10-22 ENCOUNTER — Other Ambulatory Visit: Payer: Self-pay

## 2020-10-22 ENCOUNTER — Telehealth: Payer: Self-pay | Admitting: Internal Medicine

## 2020-10-22 MED ORDER — METRONIDAZOLE 250 MG PO TABS: 250.0000 mg | ORAL_TABLET | Freq: Three times a day (TID) | ORAL | 0 refills | Status: DC

## 2020-10-22 MED ORDER — METRONIDAZOLE 250 MG PO TABS
250.0000 mg | ORAL_TABLET | Freq: Three times a day (TID) | ORAL | 0 refills | Status: AC
Start: 2020-10-22 — End: 2020-11-01

## 2020-10-22 NOTE — Telephone Encounter (Signed)
Please see result notes.  

## 2020-10-22 NOTE — Telephone Encounter (Signed)
Patient returned your call about results, please call patient one more time.   

## 2020-10-23 ENCOUNTER — Encounter (HOSPITAL_COMMUNITY): Payer: Self-pay

## 2020-10-23 ENCOUNTER — Ambulatory Visit (HOSPITAL_COMMUNITY): Payer: Medicare Other

## 2020-10-26 DIAGNOSIS — R630 Anorexia: Secondary | ICD-10-CM | POA: Diagnosis not present

## 2020-10-26 DIAGNOSIS — R110 Nausea: Secondary | ICD-10-CM | POA: Diagnosis not present

## 2020-10-27 DIAGNOSIS — R630 Anorexia: Secondary | ICD-10-CM | POA: Diagnosis not present

## 2020-10-27 DIAGNOSIS — R197 Diarrhea, unspecified: Secondary | ICD-10-CM | POA: Diagnosis not present

## 2020-10-27 DIAGNOSIS — R10816 Epigastric abdominal tenderness: Secondary | ICD-10-CM | POA: Diagnosis not present

## 2020-10-27 DIAGNOSIS — R110 Nausea: Secondary | ICD-10-CM | POA: Diagnosis not present

## 2020-10-28 DIAGNOSIS — R110 Nausea: Secondary | ICD-10-CM | POA: Diagnosis not present

## 2020-11-12 DIAGNOSIS — A0472 Enterocolitis due to Clostridium difficile, not specified as recurrent: Secondary | ICD-10-CM | POA: Diagnosis not present

## 2020-11-26 ENCOUNTER — Other Ambulatory Visit: Payer: Self-pay | Admitting: Gastroenterology

## 2020-11-26 DIAGNOSIS — A0472 Enterocolitis due to Clostridium difficile, not specified as recurrent: Secondary | ICD-10-CM | POA: Diagnosis not present

## 2020-11-26 DIAGNOSIS — R634 Abnormal weight loss: Secondary | ICD-10-CM | POA: Diagnosis not present

## 2020-11-26 DIAGNOSIS — K225 Diverticulum of esophagus, acquired: Secondary | ICD-10-CM | POA: Diagnosis not present

## 2020-11-26 DIAGNOSIS — K219 Gastro-esophageal reflux disease without esophagitis: Secondary | ICD-10-CM | POA: Diagnosis not present

## 2020-12-01 ENCOUNTER — Ambulatory Visit
Admission: RE | Admit: 2020-12-01 | Discharge: 2020-12-01 | Disposition: A | Discharge status: Home / Self Care | Payer: Medicare Other | Source: Ambulatory Visit | Attending: Gastroenterology | Admitting: Gastroenterology

## 2020-12-01 DIAGNOSIS — K225 Diverticulum of esophagus, acquired: Secondary | ICD-10-CM

## 2020-12-01 DIAGNOSIS — R1310 Dysphagia, unspecified: Secondary | ICD-10-CM | POA: Diagnosis not present

## 2020-12-16 DIAGNOSIS — M13831 Other specified arthritis, right wrist: Secondary | ICD-10-CM | POA: Diagnosis not present

## 2020-12-16 DIAGNOSIS — M1811 Unilateral primary osteoarthritis of first carpometacarpal joint, right hand: Secondary | ICD-10-CM | POA: Diagnosis not present

## 2020-12-16 DIAGNOSIS — M79641 Pain in right hand: Secondary | ICD-10-CM | POA: Diagnosis not present

## 2020-12-22 DIAGNOSIS — M5412 Radiculopathy, cervical region: Secondary | ICD-10-CM | POA: Diagnosis not present

## 2020-12-28 DIAGNOSIS — K219 Gastro-esophageal reflux disease without esophagitis: Secondary | ICD-10-CM | POA: Diagnosis not present

## 2020-12-28 DIAGNOSIS — R300 Dysuria: Secondary | ICD-10-CM | POA: Diagnosis not present

## 2020-12-28 DIAGNOSIS — F325 Major depressive disorder, single episode, in full remission: Secondary | ICD-10-CM | POA: Diagnosis not present

## 2020-12-28 DIAGNOSIS — E7800 Pure hypercholesterolemia, unspecified: Secondary | ICD-10-CM | POA: Diagnosis not present

## 2020-12-28 DIAGNOSIS — E039 Hypothyroidism, unspecified: Secondary | ICD-10-CM | POA: Diagnosis not present

## 2021-01-05 ENCOUNTER — Ambulatory Visit
Admission: RE | Admit: 2021-01-05 | Discharge: 2021-01-05 | Disposition: A | Discharge status: Home / Self Care | Payer: Medicare Other | Source: Ambulatory Visit | Attending: Internal Medicine | Admitting: Internal Medicine

## 2021-01-05 ENCOUNTER — Other Ambulatory Visit: Payer: Self-pay

## 2021-01-05 DIAGNOSIS — M810 Age-related osteoporosis without current pathological fracture: Secondary | ICD-10-CM | POA: Diagnosis not present

## 2021-01-05 DIAGNOSIS — E2839 Other primary ovarian failure: Secondary | ICD-10-CM

## 2021-01-05 DIAGNOSIS — Z780 Asymptomatic menopausal state: Secondary | ICD-10-CM | POA: Diagnosis not present

## 2021-01-05 DIAGNOSIS — M8588 Other specified disorders of bone density and structure, other site: Secondary | ICD-10-CM | POA: Diagnosis not present

## 2021-01-12 DIAGNOSIS — M1811 Unilateral primary osteoarthritis of first carpometacarpal joint, right hand: Secondary | ICD-10-CM | POA: Diagnosis not present

## 2021-01-12 DIAGNOSIS — R1319 Other dysphagia: Secondary | ICD-10-CM | POA: Diagnosis not present

## 2021-01-12 DIAGNOSIS — R1013 Epigastric pain: Secondary | ICD-10-CM | POA: Diagnosis not present

## 2021-01-12 DIAGNOSIS — R140 Abdominal distension (gaseous): Secondary | ICD-10-CM | POA: Diagnosis not present

## 2021-01-12 DIAGNOSIS — K449 Diaphragmatic hernia without obstruction or gangrene: Secondary | ICD-10-CM | POA: Diagnosis not present

## 2021-01-18 ENCOUNTER — Other Ambulatory Visit (HOSPITAL_COMMUNITY): Payer: Self-pay | Admitting: Surgery

## 2021-01-18 DIAGNOSIS — R1319 Other dysphagia: Secondary | ICD-10-CM

## 2021-01-18 DIAGNOSIS — R198 Other specified symptoms and signs involving the digestive system and abdomen: Secondary | ICD-10-CM

## 2021-01-18 DIAGNOSIS — K219 Gastro-esophageal reflux disease without esophagitis: Secondary | ICD-10-CM

## 2021-01-18 DIAGNOSIS — R1013 Epigastric pain: Secondary | ICD-10-CM

## 2021-01-18 DIAGNOSIS — R112 Nausea with vomiting, unspecified: Secondary | ICD-10-CM

## 2021-01-18 DIAGNOSIS — R140 Abdominal distension (gaseous): Secondary | ICD-10-CM

## 2021-01-19 ENCOUNTER — Telehealth: Payer: Self-pay

## 2021-01-19 ENCOUNTER — Other Ambulatory Visit: Payer: Self-pay

## 2021-01-19 DIAGNOSIS — R1310 Dysphagia, unspecified: Secondary | ICD-10-CM

## 2021-01-19 DIAGNOSIS — K449 Diaphragmatic hernia without obstruction or gangrene: Secondary | ICD-10-CM

## 2021-01-19 DIAGNOSIS — K219 Gastro-esophageal reflux disease without esophagitis: Secondary | ICD-10-CM

## 2021-01-19 NOTE — Telephone Encounter (Signed)
Spoke with the patient. She accepts the appointment as offered.  Instructed for procedure.

## 2021-01-19 NOTE — Telephone Encounter (Signed)
Referral for esophageal manometry.  Provider Dr Michael Boston, MD  Message left for the patient to call back. Scheduled for 01/22/21 at 8:30 am.

## 2021-01-22 ENCOUNTER — Ambulatory Visit (HOSPITAL_COMMUNITY)
Admission: RE | Admit: 2021-01-22 | Discharge: 2021-01-22 | Disposition: A | Discharge status: Home / Self Care | Payer: Medicare Other | Attending: Gastroenterology | Admitting: Gastroenterology

## 2021-01-22 ENCOUNTER — Encounter (HOSPITAL_COMMUNITY): Payer: Self-pay | Admitting: Gastroenterology

## 2021-01-22 ENCOUNTER — Encounter (HOSPITAL_COMMUNITY)
Admission: RE | Disposition: A | Discharge status: Home / Self Care | Payer: Self-pay | Source: Home / Self Care | Attending: Gastroenterology

## 2021-01-22 DIAGNOSIS — R1310 Dysphagia, unspecified: Secondary | ICD-10-CM | POA: Insufficient documentation

## 2021-01-22 DIAGNOSIS — R110 Nausea: Secondary | ICD-10-CM | POA: Diagnosis not present

## 2021-01-22 DIAGNOSIS — K224 Dyskinesia of esophagus: Secondary | ICD-10-CM | POA: Diagnosis not present

## 2021-01-22 DIAGNOSIS — R1319 Other dysphagia: Secondary | ICD-10-CM | POA: Diagnosis not present

## 2021-01-22 DIAGNOSIS — K219 Gastro-esophageal reflux disease without esophagitis: Secondary | ICD-10-CM

## 2021-01-22 HISTORY — PX: ESOPHAGEAL MANOMETRY: SHX5429

## 2021-01-22 SURGERY — MANOMETRY, ESOPHAGUS

## 2021-01-22 MED ORDER — LIDOCAINE VISCOUS HCL 2 % MT SOLN
OROMUCOSAL | Status: AC
  Filled 2021-01-22: qty 15

## 2021-01-22 SURGICAL SUPPLY — 2 items
FACESHIELD LNG OPTICON STERILE (SAFETY) IMPLANT
GLOVE BIO SURGEON STRL SZ8 (GLOVE) ×4 IMPLANT

## 2021-01-22 NOTE — Progress Notes (Signed)
Esophageal Manometry done per protocol. Patient tolerated well without distress or complication.  

## 2021-01-25 ENCOUNTER — Encounter (HOSPITAL_COMMUNITY): Payer: Self-pay | Admitting: Gastroenterology

## 2021-01-27 ENCOUNTER — Telehealth: Payer: Self-pay | Admitting: Gastroenterology

## 2021-01-27 NOTE — Telephone Encounter (Signed)
This patient called very upset.  She is a former patient of Dr. Carlean Purl and has since gone to another gastroenterologist.  However, it looks like we recently received referrals from a Dr. Michael Boston who referred her to Dr. Silverio Decamp.  She did not understand why her records were sent to Dr. Silverio Decamp and asked to speak to her nurse to get some clarification.  Please call patient and advise.  Thank you.

## 2021-01-27 NOTE — Telephone Encounter (Signed)
Spoke with the patient. She was referred for an esophageal manometry by Dr Johney Maine. She had her manometry 01/22/21. She wants these results to be shared with her current GI, Dr Therisa Doyne. She understands the results are not available yet. We will fax the results to the ordering physician, Dr Johney Maine. He will share his findings with Dr Sharrie Rothman, who referred her to Dr Johney Maine. The patient states this is all very confusing and she appreciates my help.

## 2021-01-28 ENCOUNTER — Ambulatory Visit (HOSPITAL_COMMUNITY)
Admission: RE | Admit: 2021-01-28 | Discharge: 2021-01-28 | Disposition: A | Discharge status: Home / Self Care | Payer: Medicare Other | Source: Ambulatory Visit | Attending: Surgery | Admitting: Surgery

## 2021-01-28 ENCOUNTER — Encounter (HOSPITAL_COMMUNITY): Payer: Self-pay

## 2021-01-28 ENCOUNTER — Other Ambulatory Visit: Payer: Self-pay

## 2021-01-28 ENCOUNTER — Encounter (HOSPITAL_COMMUNITY): Payer: Medicare Other

## 2021-01-28 DIAGNOSIS — R1319 Other dysphagia: Secondary | ICD-10-CM

## 2021-01-28 DIAGNOSIS — K449 Diaphragmatic hernia without obstruction or gangrene: Secondary | ICD-10-CM

## 2021-01-28 DIAGNOSIS — R112 Nausea with vomiting, unspecified: Secondary | ICD-10-CM

## 2021-01-28 DIAGNOSIS — R1013 Epigastric pain: Secondary | ICD-10-CM

## 2021-01-28 DIAGNOSIS — K219 Gastro-esophageal reflux disease without esophagitis: Secondary | ICD-10-CM | POA: Insufficient documentation

## 2021-01-28 DIAGNOSIS — R140 Abdominal distension (gaseous): Secondary | ICD-10-CM

## 2021-01-28 DIAGNOSIS — R198 Other specified symptoms and signs involving the digestive system and abdomen: Secondary | ICD-10-CM

## 2021-02-02 ENCOUNTER — Other Ambulatory Visit: Payer: Self-pay | Admitting: Gastroenterology

## 2021-02-04 DIAGNOSIS — Z1283 Encounter for screening for malignant neoplasm of skin: Secondary | ICD-10-CM | POA: Diagnosis not present

## 2021-02-04 DIAGNOSIS — L821 Other seborrheic keratosis: Secondary | ICD-10-CM | POA: Diagnosis not present

## 2021-02-04 DIAGNOSIS — L820 Inflamed seborrheic keratosis: Secondary | ICD-10-CM | POA: Diagnosis not present

## 2021-02-09 DIAGNOSIS — K219 Gastro-esophageal reflux disease without esophagitis: Secondary | ICD-10-CM

## 2021-02-09 DIAGNOSIS — K224 Dyskinesia of esophagus: Secondary | ICD-10-CM

## 2021-02-11 DIAGNOSIS — M810 Age-related osteoporosis without current pathological fracture: Secondary | ICD-10-CM | POA: Diagnosis not present

## 2021-02-15 ENCOUNTER — Encounter (HOSPITAL_COMMUNITY): Payer: Self-pay | Admitting: Gastroenterology

## 2021-02-15 NOTE — Progress Notes (Signed)
Attempted to obtain medical history via telephone, unable to reach at this time. I left a voicemail to return pre surgical testing department's phone call.  

## 2021-02-18 DIAGNOSIS — R1319 Other dysphagia: Secondary | ICD-10-CM | POA: Diagnosis not present

## 2021-02-18 DIAGNOSIS — M810 Age-related osteoporosis without current pathological fracture: Secondary | ICD-10-CM | POA: Diagnosis not present

## 2021-02-18 DIAGNOSIS — K219 Gastro-esophageal reflux disease without esophagitis: Secondary | ICD-10-CM | POA: Diagnosis not present

## 2021-02-22 NOTE — H&P (Signed)
General:          66 year old female referred for constipation        scheduled for EGD with Botox at [facility] 12/23/21        history of C diff infection: 10/28/20, treated with vancomycin        history of C diff in 2017        CT 10/22: previous fundoplication, diverticulosis, mobile cecum and central abdomen, otherwise unremarkable        colonoscopy, Dr. Carlean Purl, 02/2018, rectal bleeding, change in stool: diverticulosis,2 tubular adenomas removed, repeat recommendation 5 yrs        colonoscopy 2015: diverticulosis, SSA, random colon biopsies unremarkable        EGD 2015: 5 CM hiatal hernia, fundic gland polyps, no H. pylori        barium swallow 12/01/20: recurrent hiatal hernia suggesting slipped Nissen fundoplication, esophageal dysmotility        esophageal manometry 01/29/21, Dr.Nandigam: normal relaxation at GE junction, hypercontractile esophagus, consider calcium channel blocker or 5- phosphodiesterase inhibitor for symptom relief        evaluated by Dr. Michael Boston on 01/12/21: recommended to repeat esophageal manometry, gastric emptying study, antacids, repeat EGD        She is worried about having a cancer.        She states that hyoscamine has been helping.        In the morning, she feels that she has a lot of phlegm and thick mucous in the mornings but since starting hyoscamine twice a day, her sypmtoms.        She c/o have problems with swallowing, she c/o burning.        At times, even a sip of water may not go down well, while eating she tries to chew longer but at times food doesn't go down and she has to cough her food up.        She doesn't report any improvement in her swallowing.        She c/o constant acid reflux,she has worsening heartburn when she swallows hot or cold liquids.         SHe likes to let her cold drinks sit for a while before drinking it.        Her mother had issues with swallowing and she feels she "hacks" and makes the same sound as her mother used  to when she was choking.        She reports that she has tried all kinds of PPIs over the years and reports being irritable and anxious although she is medicated for anxiety and c/o gaining weight on many medications.        She is willing to try pantoprazole but states she will stop it if it worsens her anxiety.        Her Bms have improved in the past 2-3 months, she has 1 stool every other day, she doesn't have to sit on the toilet bowel all the time.        Denies nocturnal diarrhea, denies blood in stool or balck stools.        She has occassional RUQ pain, she had a cholecystectomy in 2000.     Current Medications  Taking  Calcium 500 MG Tablet 1 tablet with meals Orally Twice a day Levothyroxine Sodium 50 MCG Tablet TAKE 1 TABLET BY MOUTH EVERY DAY ON AN EMPTY STOMACH Orally Once a day DULoxetine HCl 60 MG Capsule  Delayed Release Particles 1 capsule Orally Once a day Repatha SureClick(Evolocumab) 884 MG/ML Solution Auto-injector 140mg  Subcutaneous every 2 weeks buPROPion HCl ER (XL) 300 MG Tablet Extended Release 24 Hour TAKE 1 TABLET BY MOUTH EVERY DAY IN THE MORNING  Hyoscyamine Sulfate 0.125 MG Tablet TAKE 1 TABLET BY MOUTH THREE TIMES DAILY FOR 10 DAYS AS NEEDED  Gabapentin 100 MG Capsule 2 capsules Orally every evening Vitamin B12 1000 MCG Tablet Extended Release 1 tablet Orally 2-3 times a week Vitamin D3 25 MCG (1000 UT) Tablet 1 tablet Orally every morning     Past Medical History       Dermatofibrosarcoma protuberans, right flank, surgery 2012.     Hypothyroidism.      major depression/anxiety Health and safety inspector) .      Seasonal allergic rhinitis.      multiple allergies including salmon, peanut oil, several medications, anaphylaxis with Zicam cold.      Adenomatous colon polyp 4/15      Benign gastric polyps.      Hyperlipidemia.      GER, Nissen fundoplication 1660.      Squamous cell carcinoma of skin.      symmetric small fiber polyneuropathy, [facility] 2015 chronic pain  (jeff adams).      Irritable bowel syndrome,.      DDD L3-4.      Distant history of muscle relaxant, sleeping pill, narcotic abuse.      C diff admission 4/17.      hepatic steatosis on CT scan 2017.     aortic atherosclerosis on CT scan just 2017.      IFG.      L shoulder torn rotater cuff.      L hip DJD, PRP helped Niel Hummer.      CT abdomen 10/22 with diverticulosis.      derm register, pain Dr Nelva Bush surgery Barbra Sarks previous Carlean Purl, switching to Therisa Doyne [facility].       C diff managed outpatient 10/2020.       Osteoporosis, 1/20 3T -2.4 spine -3.2 hip reclast /23-.   Surgical History        Dermatofibrosarcoma protuberans, r abdomen 2012        tumor upper back, likely benign 2010        cholecystectomy         laparoscopic endometriosis fulguration         colonoscopy-polyps removed 04/26/2013        Nissen fundoplication - Dr. Hassell Done 05/2017      Family History  Father: deceased 32 yrs, cad at 94, diagnosed with CVA, Coronary artery disease  Mother: deceased 45 yrs, Hypothyroid, dementia, alcoholism  Sister 1: alive, high cholesterol  Sister 2: alive  Sister 3: alive  Maternal aunt: colon cancer  Son(s): alive  3 sister(s) . 2 son(s) .   no family history of colon polyps or liver disease.    Social History  General:   Tobacco use      cigarettes: Former smoker    Quit in year 2005    Pack-year Hx: 15    Tobacco history last updated 02/18/2021    Additional Findings: Tobacco Non-User Ex-moderate cigarette smoker (10-19/day) no EXPOSURE TO PASSIVE SMOKE.  no Alcohol, no.  no Recreational drug use.  Exercise: swims 5x a week.  Marital Status: Divorced.  Children: 2, son (s).  OCCUPATION: employed, disability.      Allergies  Sulfa drugs (for allergy): rash  Statins: weight gaiin/anxious  Zicam Cold Remedy: ER hive & gasping for air  Sudafed: hyper  Erythrocin: hives  Amoxicillin: hives  Latex: rash  Clindamycin HCl: C-Diff,  dehydration  Cefdinir: C-Diff  ALL NUTS & PRODUCTS (Peanuts too): itchy, hives, shock  Metformin HCl: diarrhea  Doxycycline Hyclate: swollen neck glands  Rosuvastatin: craves sweets      Hospitalization/Major Diagnostic Procedure  cancer surgery overnight   Cardiac issues 05/24/2014  none in past year 02/2021     Review of Systems  GI PROCEDURE:         Pacemaker/ AICD no. Artificial heart valves no. MI/heart attack no. Abnormal heart rhythm no. Angina no. CVA no. Hypertension no. Hypotension no. Asthma, COPD no. Sleep apnea no. Seizure disorders no. Artificial joints no. Diabetes no. Significant headaches no. Vertigo YES. Depression/anxiety YES. Abnormal bleeding no. Kidney Disease no. Liver disease no. Blood transfusion no.      Vital Signs  Wt 145.6, Wt change .1 lb, Ht 63.75, Temp 97.9, Pulse sitting 87, BP sitting 114/75.  Examination  Gastroenterology::       GENERAL APPEARANCE: Well developed, well nourished, no active distress, pleasant.        SCLERA: anicteric.        CARDIOVASCULAR Normal RRR .        RESPIRATORY Breath sounds normal. Respiration even and unlabored.        ABDOMEN No masses palpated. Liver and spleen not palpated, normal. Bowel sounds normal, Abdomen not distended.        EXTREMITIES: No edema, right wrist cast.        NEURO: alert, oriented to time, place and person, normal gait.        PSYCH: anxious.       Assessments    1. Esophageal dysphagia - R13.19 (Primary)  2. Mild acid reflux - K21.9   Treatment  1. Esophageal dysphagia   Notes: She is scheduled for EGD with botox injection, possible balloon diation at [facility] on 02/23/21. The risks and the benefits of the procedure were discussed with the patient in details. She understands and verbalizes consent. She was recommended a calcium channel blocker or a 5 phosphodiesterase for hypertensive esophagus. However, patient is very anxious about trying any new medications, although I discussed in  details about the risks and benefits of the same.      2. Mild acid reflux   Start Pantoprazole Sodium Tablet Delayed Release, 40 MG, 1 tablet, Orally, Once a day, 30 day(s), 30, Refills 1 Notes: Meanwhile, I have advised the patient to take pantoprazole 40 milligrams once a day. Patient is very anxious and states that she is worried that pantoprazole will cause worsening anxiety and weight gain and reports that if she feels pantoprazole is causing such symptoms, she does not want to take and acid suppressants.

## 2021-02-23 ENCOUNTER — Ambulatory Visit (HOSPITAL_COMMUNITY)
Admission: RE | Admit: 2021-02-23 | Discharge: 2021-02-23 | Disposition: A | Discharge status: Home / Self Care | Payer: Medicare Other | Attending: Gastroenterology | Admitting: Gastroenterology

## 2021-02-23 ENCOUNTER — Ambulatory Visit (HOSPITAL_COMMUNITY): Payer: Medicare Other | Admitting: Certified Registered Nurse Anesthetist

## 2021-02-23 ENCOUNTER — Ambulatory Visit (HOSPITAL_BASED_OUTPATIENT_CLINIC_OR_DEPARTMENT_OTHER): Payer: Medicare Other | Admitting: Certified Registered Nurse Anesthetist

## 2021-02-23 ENCOUNTER — Encounter (HOSPITAL_COMMUNITY): Payer: Self-pay | Admitting: Gastroenterology

## 2021-02-23 ENCOUNTER — Encounter (HOSPITAL_COMMUNITY)
Admission: RE | Disposition: A | Discharge status: Home / Self Care | Payer: Self-pay | Source: Home / Self Care | Attending: Gastroenterology

## 2021-02-23 DIAGNOSIS — E785 Hyperlipidemia, unspecified: Secondary | ICD-10-CM | POA: Diagnosis not present

## 2021-02-23 DIAGNOSIS — K219 Gastro-esophageal reflux disease without esophagitis: Secondary | ICD-10-CM | POA: Diagnosis not present

## 2021-02-23 DIAGNOSIS — Z800 Family history of malignant neoplasm of digestive organs: Secondary | ICD-10-CM | POA: Diagnosis not present

## 2021-02-23 DIAGNOSIS — K317 Polyp of stomach and duodenum: Secondary | ICD-10-CM | POA: Diagnosis not present

## 2021-02-23 DIAGNOSIS — K319 Disease of stomach and duodenum, unspecified: Secondary | ICD-10-CM | POA: Diagnosis not present

## 2021-02-23 DIAGNOSIS — Z79899 Other long term (current) drug therapy: Secondary | ICD-10-CM | POA: Insufficient documentation

## 2021-02-23 DIAGNOSIS — R0989 Other specified symptoms and signs involving the circulatory and respiratory systems: Secondary | ICD-10-CM | POA: Diagnosis not present

## 2021-02-23 DIAGNOSIS — M1990 Unspecified osteoarthritis, unspecified site: Secondary | ICD-10-CM | POA: Diagnosis not present

## 2021-02-23 DIAGNOSIS — K3189 Other diseases of stomach and duodenum: Secondary | ICD-10-CM | POA: Diagnosis not present

## 2021-02-23 DIAGNOSIS — K760 Fatty (change of) liver, not elsewhere classified: Secondary | ICD-10-CM | POA: Diagnosis not present

## 2021-02-23 DIAGNOSIS — K449 Diaphragmatic hernia without obstruction or gangrene: Secondary | ICD-10-CM | POA: Diagnosis not present

## 2021-02-23 DIAGNOSIS — Q399 Congenital malformation of esophagus, unspecified: Secondary | ICD-10-CM | POA: Diagnosis not present

## 2021-02-23 DIAGNOSIS — Z87891 Personal history of nicotine dependence: Secondary | ICD-10-CM | POA: Diagnosis not present

## 2021-02-23 DIAGNOSIS — R1310 Dysphagia, unspecified: Secondary | ICD-10-CM | POA: Diagnosis not present

## 2021-02-23 DIAGNOSIS — K222 Esophageal obstruction: Secondary | ICD-10-CM | POA: Diagnosis not present

## 2021-02-23 DIAGNOSIS — E039 Hypothyroidism, unspecified: Secondary | ICD-10-CM | POA: Insufficient documentation

## 2021-02-23 DIAGNOSIS — R933 Abnormal findings on diagnostic imaging of other parts of digestive tract: Secondary | ICD-10-CM | POA: Diagnosis not present

## 2021-02-23 DIAGNOSIS — K589 Irritable bowel syndrome without diarrhea: Secondary | ICD-10-CM | POA: Diagnosis not present

## 2021-02-23 HISTORY — PX: BALLOON DILATION: SHX5330

## 2021-02-23 HISTORY — PX: BOTOX INJECTION: SHX5754

## 2021-02-23 HISTORY — PX: ESOPHAGOGASTRODUODENOSCOPY (EGD) WITH PROPOFOL: SHX5813

## 2021-02-23 HISTORY — PX: BIOPSY: SHX5522

## 2021-02-23 SURGERY — ESOPHAGOGASTRODUODENOSCOPY (EGD) WITH PROPOFOL
Anesthesia: Monitor Anesthesia Care

## 2021-02-23 MED ORDER — ONABOTULINUMTOXINA 100 UNITS IJ SOLR
INTRAMUSCULAR | Status: AC
  Filled 2021-02-23: qty 100

## 2021-02-23 MED ORDER — SODIUM CHLORIDE (PF) 0.9 % IJ SOLN
INTRAMUSCULAR | Status: DC | PRN
  Administered 2021-02-23: 5 mL via SUBMUCOSAL

## 2021-02-23 MED ORDER — LIDOCAINE 2% (20 MG/ML) 5 ML SYRINGE
INTRAMUSCULAR | Status: DC | PRN
  Administered 2021-02-23: 60 mg via INTRAVENOUS

## 2021-02-23 MED ORDER — PROPOFOL 500 MG/50ML IV EMUL
INTRAVENOUS | Status: AC
  Filled 2021-02-23: qty 50

## 2021-02-23 MED ORDER — SODIUM CHLORIDE (PF) 0.9 % IJ SOLN
INTRAMUSCULAR | Status: AC
  Filled 2021-02-23: qty 10

## 2021-02-23 MED ORDER — SODIUM CHLORIDE 0.9 % IV SOLN: INTRAVENOUS | Status: DC

## 2021-02-23 MED ORDER — LACTATED RINGERS IV SOLN
INTRAVENOUS | Status: DC
  Administered 2021-02-23: 1000 mL via INTRAVENOUS

## 2021-02-23 MED ORDER — PROPOFOL 10 MG/ML IV BOLUS
INTRAVENOUS | Status: DC | PRN
  Administered 2021-02-23: 20 mg via INTRAVENOUS

## 2021-02-23 MED ORDER — PROPOFOL 500 MG/50ML IV EMUL
INTRAVENOUS | Status: DC | PRN
  Administered 2021-02-23: 125 ug/kg/min via INTRAVENOUS

## 2021-02-23 SURGICAL SUPPLY — 15 items

## 2021-02-23 NOTE — Anesthesia Procedure Notes (Addendum)
Procedure Name: MAC Date/Time: 02/23/2021 10:13 AM Performed by: West Pugh, CRNA Pre-anesthesia Checklist: Patient identified, Emergency Drugs available, Suction available, Patient being monitored and Timeout performed Patient Re-evaluated:Patient Re-evaluated prior to induction Oxygen Delivery Method: Simple face mask Placement Confirmation: positive ETCO2 Dental Injury: Teeth and Oropharynx as per pre-operative assessment

## 2021-02-23 NOTE — Anesthesia Postprocedure Evaluation (Signed)
Anesthesia Post Note  Patient: Connie Dillon  Procedure(s) Performed: ESOPHAGOGASTRODUODENOSCOPY (EGD) WITH PROPOFOL BOTOX INJECTION BIOPSY BALLOON DILATION     Patient location during evaluation: Endoscopy Anesthesia Type: MAC Level of consciousness: awake Pain management: pain level controlled Vital Signs Assessment: post-procedure vital signs reviewed and stable Respiratory status: spontaneous breathing Cardiovascular status: stable Postop Assessment: no apparent nausea or vomiting Anesthetic complications: no   No notable events documented.  Last Vitals:  Vitals:   02/23/21 1050 02/23/21 1100  BP: (!) 133/44 140/89  Pulse: 88   Resp: 15   Temp:    SpO2: 96% 96%    Last Pain:  Vitals:   02/23/21 1100  TempSrc:   PainSc: 0-No pain                 Feiga Nadel

## 2021-02-23 NOTE — Anesthesia Preprocedure Evaluation (Addendum)
Anesthesia Evaluation  Patient identified by MRN, date of birth, ID band Patient awake    Reviewed: Allergy & Precautions, NPO status , Patient's Chart, lab work & pertinent test results  Airway Mallampati: II  TM Distance: >3 FB     Dental   Pulmonary shortness of breath, pneumonia, former smoker,    breath sounds clear to auscultation       Cardiovascular negative cardio ROS   Rhythm:Regular Rate:Normal     Neuro/Psych  Neuromuscular disease    GI/Hepatic Neg liver ROS, hiatal hernia, GERD  ,  Endo/Other  Hypothyroidism   Renal/GU negative Renal ROS     Musculoskeletal  (+) Arthritis ,   Abdominal   Peds  Hematology   Anesthesia Other Findings   Reproductive/Obstetrics                            Anesthesia Physical Anesthesia Plan  ASA: 3  Anesthesia Plan: MAC   Post-op Pain Management:    Induction: Intravenous  PONV Risk Score and Plan: 2 and Treatment may vary due to age or medical condition and Propofol infusion  Airway Management Planned: Simple Face Mask  Additional Equipment:   Intra-op Plan:   Post-operative Plan:   Informed Consent: I have reviewed the patients History and Physical, chart, labs and discussed the procedure including the risks, benefits and alternatives for the proposed anesthesia with the patient or authorized representative who has indicated his/her understanding and acceptance.     Dental advisory given  Plan Discussed with: CRNA and Anesthesiologist  Anesthesia Plan Comments:         Anesthesia Quick Evaluation

## 2021-02-23 NOTE — Op Note (Signed)
[facility] Patient Name: Connie Dillon Procedure Date: 02/23/2021 MRN: [id number] Attending MD: Ronnette Juniper , MD Date of Birth: 1955-11-23 CSN: [id number] Age: 66 Admit Type: Outpatient Procedure:                Upper GI endoscopy Indications:              Dysphagia, Suspected gastro-esophageal reflux                            disease, abnormal esophageal manometry Providers:                Ronnette Juniper, MD, Jaci Carrel, RN, Cherylynn Ridges,                            Technician, Christell Faith, CRNA Referring MD:             Celene Kras Medicines:                Monitored Anesthesia Care Complications:            No immediate complications. Estimated blood loss:                            Minimal. Estimated Blood Loss:     Estimated blood loss was minimal. Procedure:                Pre-Anesthesia Assessment:                           - Prior to the procedure, a History and Physical                            was performed, and patient medications and                            allergies were reviewed. The patient's tolerance of                            previous anesthesia was also reviewed. The risks                            and benefits of the procedure and the sedation                            options and risks were discussed with the patient.                            All questions were answered, and informed consent                            was obtained. Prior Anticoagulants: The patient has                            taken no previous anticoagulant or antiplatelet                            agents. ASA  Grade Assessment: III - A patient with                            severe systemic disease. After reviewing the risks                            and benefits, the patient was deemed in                            satisfactory condition to undergo the procedure.                           After obtaining informed consent, the endoscope was                             passed under direct vision. Throughout the                            procedure, the patient's blood pressure, pulse, and                            oxygen saturations were monitored continuously. The                            [id number]-H190 ([id number]) Olympus endoscope was introduced                            through the mouth, and advanced to the second part                            of duodenum. The upper GI endoscopy was                            accomplished without difficulty. The patient                            tolerated the procedure well. Scope In: Scope Out: Findings:      The upper third of the esophagus and middle third of the esophagus were       normal. Biopsies were obtained from the proximal and distal esophagus       with cold forceps for histology of suspected eosinophilic esophagitis.      The lower third of the esophagus was significantly tortuous.      A widely patent Schatzki ring was found at the gastroesophageal       junction. A TTS dilator was passed through the scope. Dilation with an       18-19-20 mm balloon dilator was performed to 20 mm for 2 minutes. The       dilation site was examined following endoscope reinsertion and showed no       change. Area was successfully injected with 100 units botulinum toxin,       25 units/ml, 1 ml in four quadrants, 1 cm above GE junction.      A 4 cm hiatal hernia was present.      A few small sessile polyps  with no stigmata of recent bleeding were       found in the cardia, in the gastric fundus and in the gastric body,       appearance typical of benign fundic gland polyps.      Localized mildly erythematous mucosa without bleeding was found in the       gastric antrum. Biopsies were taken with a cold forceps for Helicobacter       pylori testing.      Evidence of a fundoplication was found in the gastric fundus. The wrap       appeared loose. This was traversed.      The examined duodenum was  normal. Impression:               - Normal upper third of esophagus and middle third                            of esophagus. Biopsied.                           - Tortuous esophagus.                           - Widely patent Schatzki ring. Dilated. Injected                            with botulinum toxin.                           - 4 cm hiatal hernia.                           - A few gastric polyps.                           - Erythematous mucosa in the antrum. Biopsied.                           - A fundoplication was found. The wrap appears                            loose.                           - Normal examined duodenum. Moderate Sedation:      Patient did not receive moderate sedation for this procedure, but       instead received monitored anesthesia care. Recommendation:           - Patient has a contact number available for                            emergencies. The signs and symptoms of potential                            delayed complications were discussed with the                            patient. Return to normal activities tomorrow.  Written discharge instructions were provided to the                            patient.                           - Resume regular diet.                           - Continue present medications.                           - Await pathology results.                           - Aggressive anti reflux measures such as weight                            loss, elevate head end of the bed during sleep,                            avoid or limit caffeinated products and space last                            meal of the day and bedtime by at least 3 hours. Procedure Code(s):        --- Professional ---                           [phone], Esophagogastroduodenoscopy, flexible,                            transoral; with transendoscopic balloon dilation of                            esophagus (less than 30 mm diameter)                            43239, 59, Esophagogastroduodenoscopy, flexible,                            transoral; with biopsy, single or multiple                           43236, 59, Esophagogastroduodenoscopy, flexible,                            transoral; with directed submucosal injection(s),                            any substance Diagnosis Code(s):        --- Professional ---                           Q39.9, Congenital malformation of esophagus,  unspecified                           K22.2, Esophageal obstruction                           K44.9, Diaphragmatic hernia without obstruction or                            gangrene                           K31.7, Polyp of stomach and duodenum                           K31.89, Other diseases of stomach and duodenum                           Z98.890, Other specified postprocedural states                           R13.10, Dysphagia, unspecified CPT copyright 2019 [employer]. All rights reserved. The codes documented in this report are preliminary and upon coder review may  be revised to meet current compliance requirements. Ronnette Juniper, MD 02/23/2021 10:37:44 AM This report has been signed electronically. Number of Addenda: 0

## 2021-02-23 NOTE — Transfer of Care (Signed)
Immediate Anesthesia Transfer of Care Note  Patient: Connie Dillon  Procedure(s) Performed: ESOPHAGOGASTRODUODENOSCOPY (EGD) WITH PROPOFOL BOTOX INJECTION BIOPSY BALLOON DILATION  Patient Location: PACU and Endoscopy Unit  Anesthesia Type:MAC  Level of Consciousness: awake, alert  and patient cooperative  Airway & Oxygen Therapy: Patient Spontanous Breathing and Patient connected to face mask oxygen  Post-op Assessment: Report given to RN and Post -op Vital signs reviewed and stable  Post vital signs: Reviewed and stable  Last Vitals:  Vitals Value Taken Time  BP 124/65 02/23/21 1040  Temp    Pulse 97 02/23/21 1041  Resp 18 02/23/21 1041  SpO2 94 % 02/23/21 1041  Vitals shown include unvalidated device data.  Last Pain:  Vitals:   02/23/21 0945  TempSrc: Oral         Complications: No notable events documented.

## 2021-02-23 NOTE — Discharge Instructions (Signed)

## 2021-02-24 ENCOUNTER — Encounter (HOSPITAL_COMMUNITY): Payer: Self-pay | Admitting: Gastroenterology

## 2021-02-24 LAB — SURGICAL PATHOLOGY

## 2021-03-04 DIAGNOSIS — H5203 Hypermetropia, bilateral: Secondary | ICD-10-CM | POA: Diagnosis not present

## 2021-03-23 ENCOUNTER — Other Ambulatory Visit: Payer: Self-pay | Admitting: Internal Medicine

## 2021-03-23 DIAGNOSIS — Z1231 Encounter for screening mammogram for malignant neoplasm of breast: Secondary | ICD-10-CM

## 2021-03-23 DIAGNOSIS — H524 Presbyopia: Secondary | ICD-10-CM | POA: Diagnosis not present

## 2021-03-25 ENCOUNTER — Ambulatory Visit
Admission: RE | Admit: 2021-03-25 | Discharge: 2021-03-25 | Disposition: A | Discharge status: Home / Self Care | Payer: Medicare Other | Source: Ambulatory Visit | Attending: Internal Medicine | Admitting: Internal Medicine

## 2021-03-25 DIAGNOSIS — G629 Polyneuropathy, unspecified: Secondary | ICD-10-CM | POA: Diagnosis not present

## 2021-03-25 DIAGNOSIS — K224 Dyskinesia of esophagus: Secondary | ICD-10-CM | POA: Diagnosis not present

## 2021-03-25 DIAGNOSIS — I700 Atherosclerosis of aorta: Secondary | ICD-10-CM | POA: Diagnosis not present

## 2021-03-25 DIAGNOSIS — E7800 Pure hypercholesterolemia, unspecified: Secondary | ICD-10-CM | POA: Diagnosis not present

## 2021-03-25 DIAGNOSIS — Z1231 Encounter for screening mammogram for malignant neoplasm of breast: Secondary | ICD-10-CM | POA: Diagnosis not present

## 2021-03-26 ENCOUNTER — Other Ambulatory Visit: Payer: Self-pay | Admitting: Internal Medicine

## 2021-03-26 DIAGNOSIS — R928 Other abnormal and inconclusive findings on diagnostic imaging of breast: Secondary | ICD-10-CM

## 2021-04-12 DIAGNOSIS — H903 Sensorineural hearing loss, bilateral: Secondary | ICD-10-CM | POA: Diagnosis not present

## 2021-04-14 DIAGNOSIS — M1811 Unilateral primary osteoarthritis of first carpometacarpal joint, right hand: Secondary | ICD-10-CM | POA: Diagnosis not present

## 2021-04-14 DIAGNOSIS — M13831 Other specified arthritis, right wrist: Secondary | ICD-10-CM | POA: Diagnosis not present

## 2021-04-21 DIAGNOSIS — M810 Age-related osteoporosis without current pathological fracture: Secondary | ICD-10-CM | POA: Diagnosis not present

## 2021-04-27 DIAGNOSIS — M7062 Trochanteric bursitis, left hip: Secondary | ICD-10-CM | POA: Diagnosis not present

## 2021-04-27 DIAGNOSIS — M47896 Other spondylosis, lumbar region: Secondary | ICD-10-CM | POA: Diagnosis not present

## 2021-05-05 DIAGNOSIS — R7301 Impaired fasting glucose: Secondary | ICD-10-CM | POA: Diagnosis not present

## 2021-05-05 DIAGNOSIS — E7800 Pure hypercholesterolemia, unspecified: Secondary | ICD-10-CM | POA: Diagnosis not present

## 2021-05-12 DIAGNOSIS — H65193 Other acute nonsuppurative otitis media, bilateral: Secondary | ICD-10-CM | POA: Diagnosis not present

## 2021-06-04 DIAGNOSIS — L820 Inflamed seborrheic keratosis: Secondary | ICD-10-CM | POA: Diagnosis not present

## 2021-06-04 DIAGNOSIS — L218 Other seborrheic dermatitis: Secondary | ICD-10-CM | POA: Diagnosis not present

## 2021-06-24 DIAGNOSIS — M5412 Radiculopathy, cervical region: Secondary | ICD-10-CM | POA: Diagnosis not present

## 2021-06-30 DIAGNOSIS — R300 Dysuria: Secondary | ICD-10-CM | POA: Diagnosis not present

## 2021-07-16 DIAGNOSIS — F419 Anxiety disorder, unspecified: Secondary | ICD-10-CM | POA: Diagnosis not present

## 2021-07-16 DIAGNOSIS — H9313 Tinnitus, bilateral: Secondary | ICD-10-CM | POA: Diagnosis not present

## 2021-07-26 DIAGNOSIS — M1811 Unilateral primary osteoarthritis of first carpometacarpal joint, right hand: Secondary | ICD-10-CM | POA: Diagnosis not present

## 2021-08-03 DIAGNOSIS — K641 Second degree hemorrhoids: Secondary | ICD-10-CM | POA: Diagnosis not present

## 2021-08-09 DIAGNOSIS — H9313 Tinnitus, bilateral: Secondary | ICD-10-CM | POA: Diagnosis not present

## 2021-08-09 DIAGNOSIS — H9113 Presbycusis, bilateral: Secondary | ICD-10-CM | POA: Diagnosis not present

## 2021-08-12 DIAGNOSIS — M1811 Unilateral primary osteoarthritis of first carpometacarpal joint, right hand: Secondary | ICD-10-CM | POA: Diagnosis not present

## 2021-08-12 DIAGNOSIS — M25532 Pain in left wrist: Secondary | ICD-10-CM | POA: Diagnosis not present

## 2021-08-23 DIAGNOSIS — M25522 Pain in left elbow: Secondary | ICD-10-CM | POA: Diagnosis not present

## 2021-08-23 DIAGNOSIS — M7540 Impingement syndrome of unspecified shoulder: Secondary | ICD-10-CM | POA: Diagnosis not present

## 2021-08-23 DIAGNOSIS — M25512 Pain in left shoulder: Secondary | ICD-10-CM | POA: Diagnosis not present

## 2021-09-10 IMAGING — CR DG RIBS W/ CHEST 3+V*R*
5 series · 5 of 5 positions shown · non-contrast
Comparison: None.

CLINICAL DATA: Right rib pain.  Fall 06/16/2020

EXAM:
RIGHT RIBS AND CHEST - 3+ VIEW

[w chest pa *]
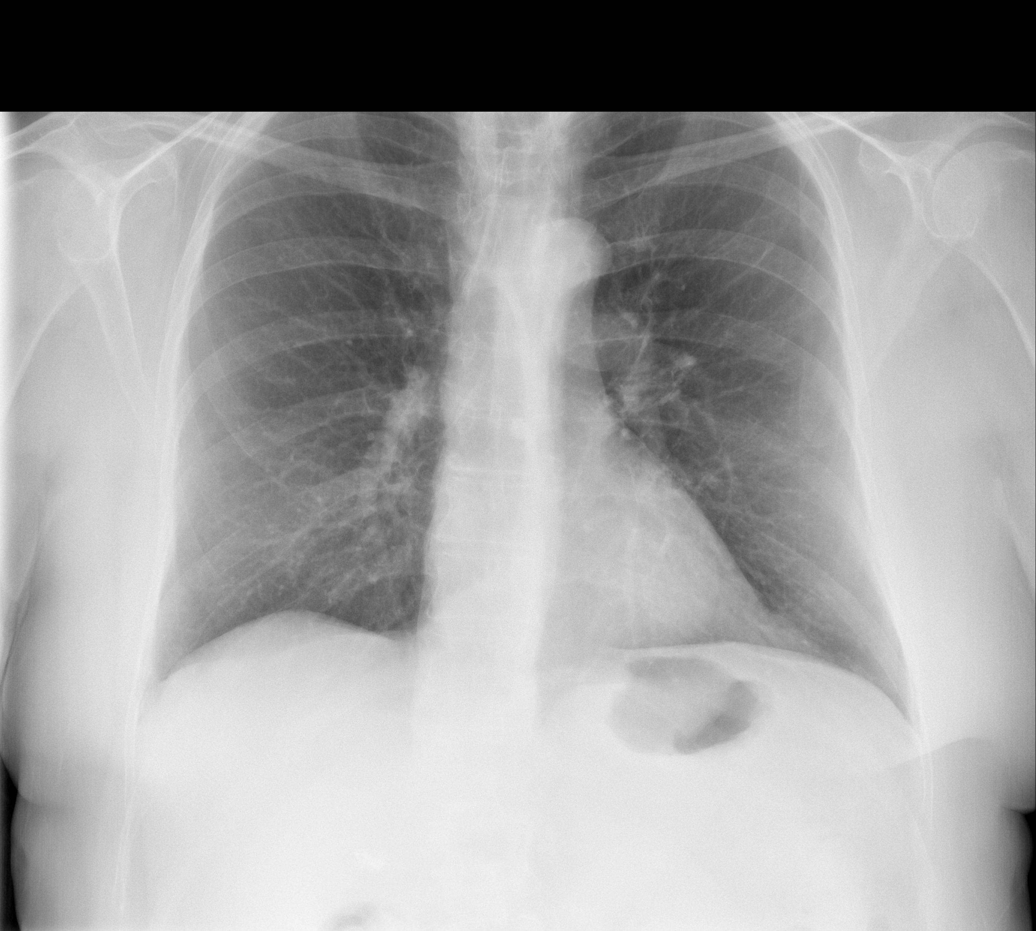

[w ribs ap/pa upper right]
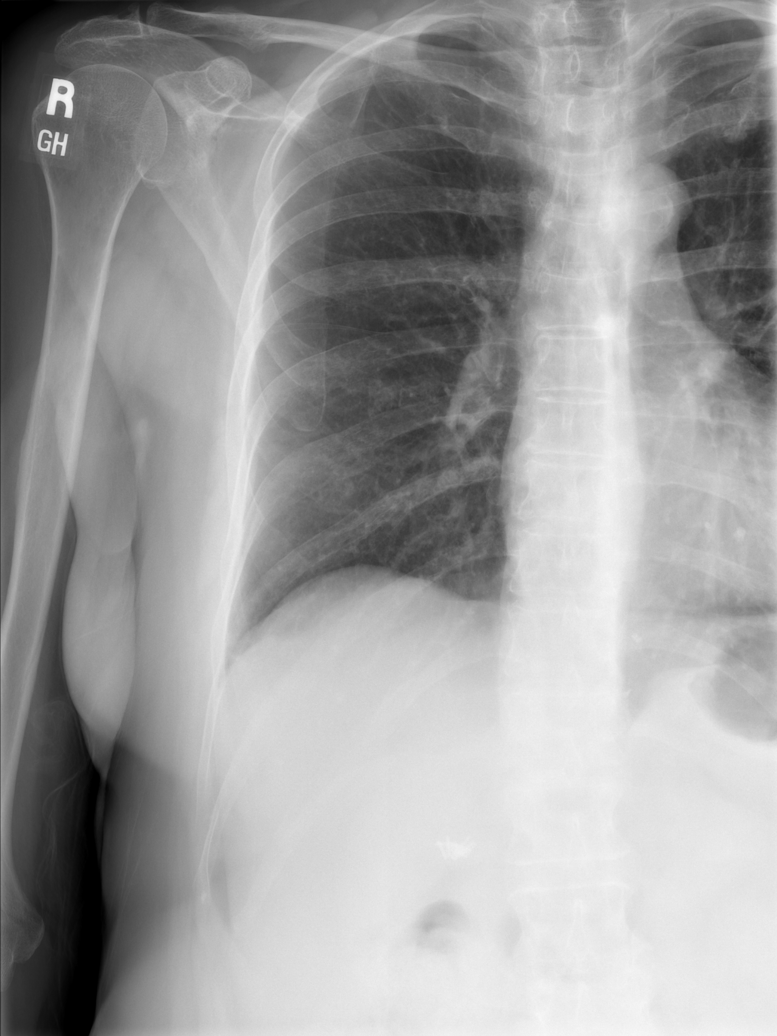

[w ribs ap/pa lower right]
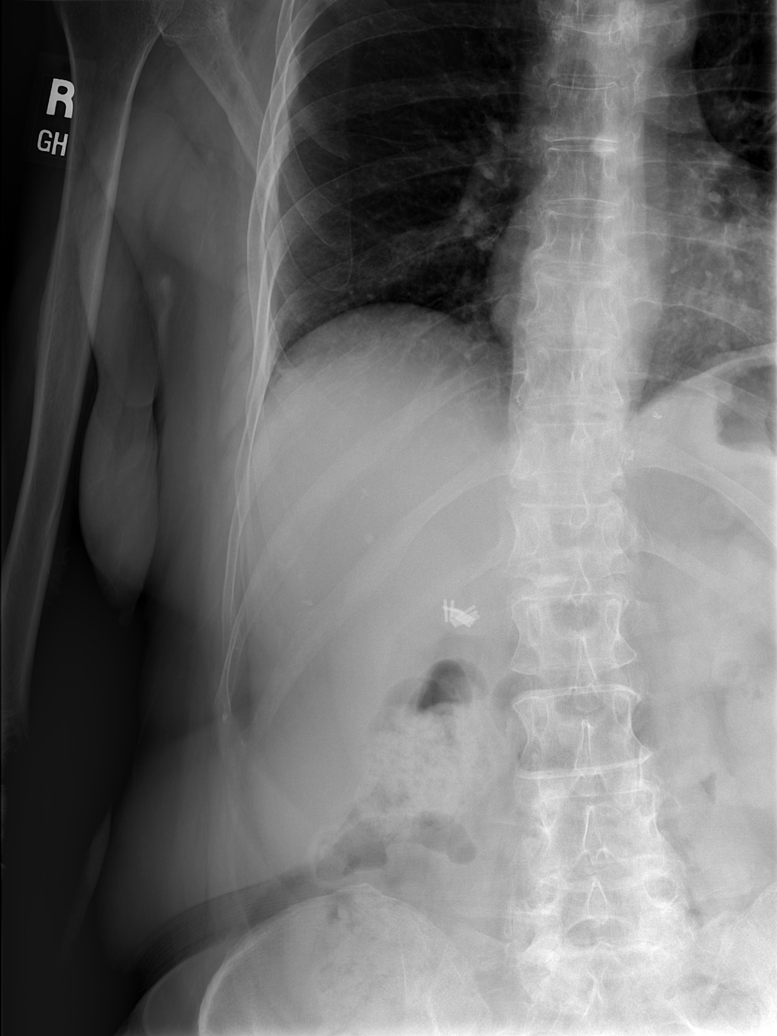

[w ribs oblique right (1 of 2)]
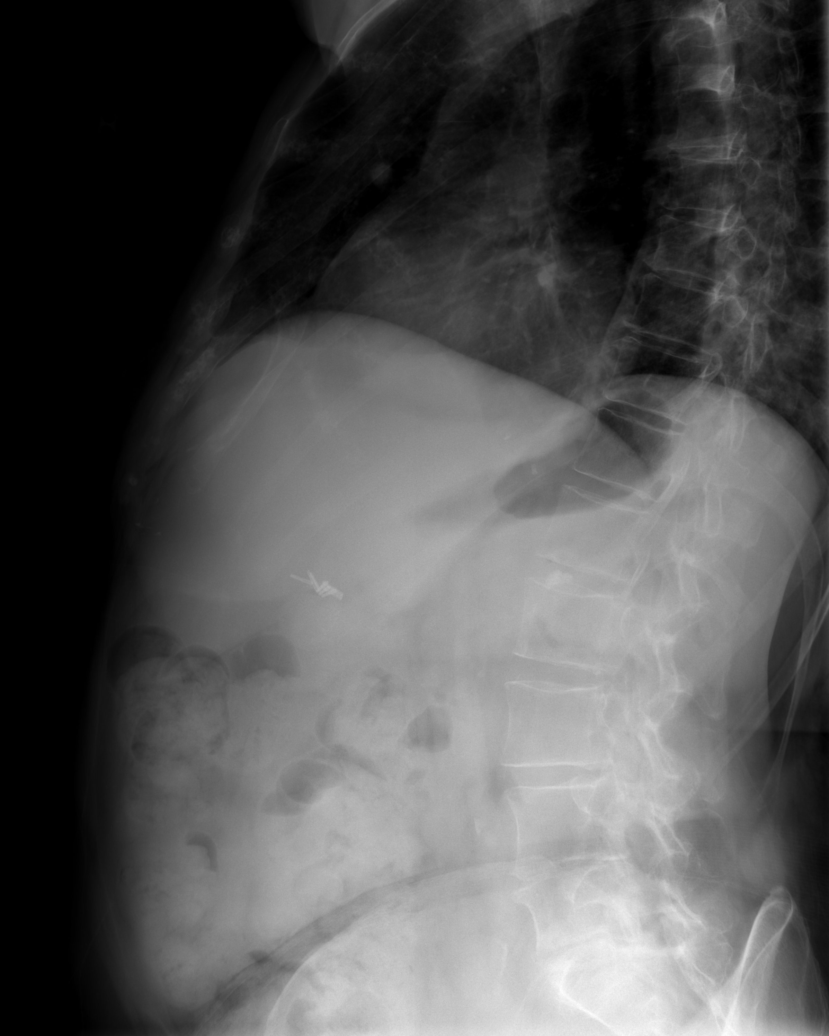

[w ribs oblique right (2 of 2)]
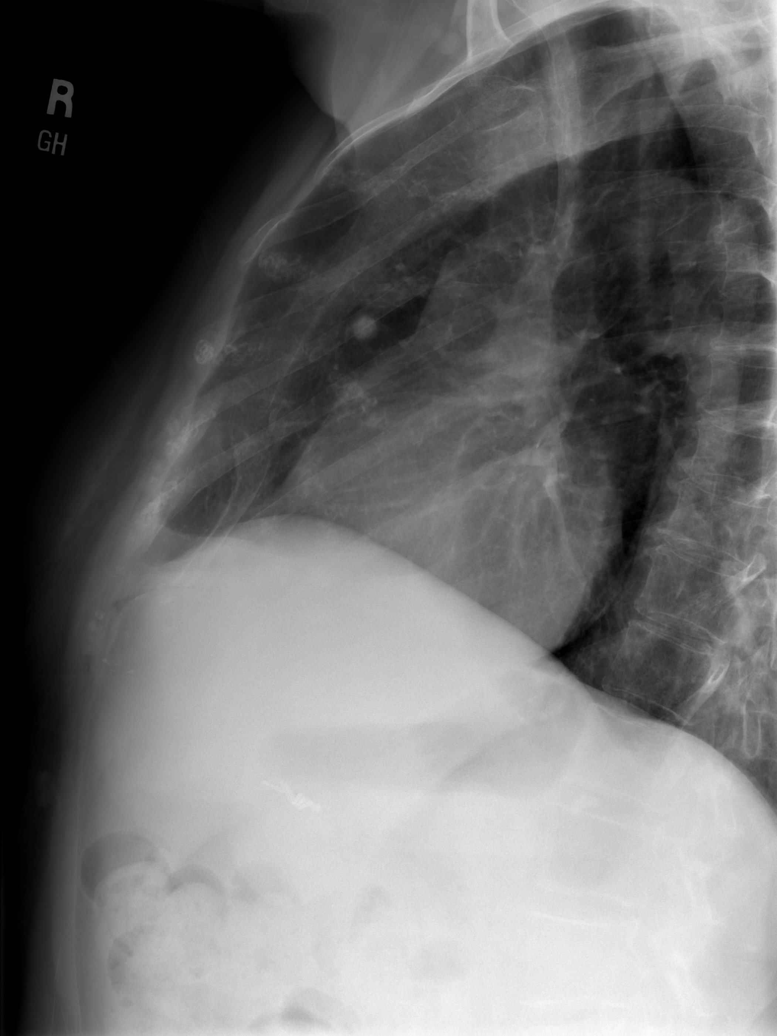

[5 of 5 positions shown; findings below may reference images not displayed]

FINDINGS: Minimal cortical irregularity about the posterior right ninth rib
suspicious for nondisplaced fracture. No other rib fracture. No
evidence of focal bone lesion. Oblique views are limited by osseous
overlap. There is no evidence of pneumothorax or pleural effusion.
Both lungs are clear. Heart size and mediastinal contours are within
normal limits.
IMPRESSION: Minimal cortical irregularity about the posterior right ninth rib
suspicious for nondisplaced fracture. No pneumothorax or pulmonary
complication.

## 2021-11-01 ENCOUNTER — Encounter: Payer: Self-pay | Admitting: *Deleted

## 2023-02-22 ENCOUNTER — Encounter: Payer: Self-pay | Admitting: Internal Medicine
